# Patient Record
Sex: Male | Born: 1960 | Race: White | Hispanic: No | Marital: Married | Smoking: Former smoker
Health system: Southern US, Community
[De-identification: ages and names within clinical notes are randomized; demographics above are authoritative.]

## PROBLEM LIST (undated history)

## (undated) DIAGNOSIS — I509 Heart failure, unspecified: Secondary | ICD-10-CM

## (undated) DIAGNOSIS — E7800 Pure hypercholesterolemia, unspecified: Secondary | ICD-10-CM

## (undated) DIAGNOSIS — I213 ST elevation (STEMI) myocardial infarction of unspecified site: Secondary | ICD-10-CM

## (undated) DIAGNOSIS — E785 Hyperlipidemia, unspecified: Secondary | ICD-10-CM

## (undated) DIAGNOSIS — I519 Heart disease, unspecified: Secondary | ICD-10-CM

## (undated) DIAGNOSIS — I2510 Atherosclerotic heart disease of native coronary artery without angina pectoris: Secondary | ICD-10-CM

## (undated) DIAGNOSIS — R600 Localized edema: Secondary | ICD-10-CM

## (undated) DIAGNOSIS — F419 Anxiety disorder, unspecified: Secondary | ICD-10-CM

## (undated) DIAGNOSIS — Z87442 Personal history of urinary calculi: Secondary | ICD-10-CM

## (undated) DIAGNOSIS — J189 Pneumonia, unspecified organism: Secondary | ICD-10-CM

## (undated) DIAGNOSIS — G4730 Sleep apnea, unspecified: Secondary | ICD-10-CM

## (undated) DIAGNOSIS — I480 Paroxysmal atrial fibrillation: Secondary | ICD-10-CM

## (undated) DIAGNOSIS — E119 Type 2 diabetes mellitus without complications: Secondary | ICD-10-CM

## (undated) DIAGNOSIS — E876 Hypokalemia: Secondary | ICD-10-CM

## (undated) DIAGNOSIS — R0789 Other chest pain: Secondary | ICD-10-CM

## (undated) DIAGNOSIS — R296 Repeated falls: Secondary | ICD-10-CM

## (undated) DIAGNOSIS — I872 Venous insufficiency (chronic) (peripheral): Secondary | ICD-10-CM

## (undated) DIAGNOSIS — I10 Essential (primary) hypertension: Secondary | ICD-10-CM

## (undated) HISTORY — DX: ST elevation (STEMI) myocardial infarction of unspecified site: I21.3

## (undated) HISTORY — PX: APPENDECTOMY: SHX54

## (undated) HISTORY — DX: Atherosclerotic heart disease of native coronary artery without angina pectoris: I25.10

## (undated) HISTORY — DX: Localized edema: R60.0

## (undated) HISTORY — DX: Venous insufficiency (chronic) (peripheral): I87.2

## (undated) HISTORY — PX: LAPAROSCOPIC INCISIONAL / UMBILICAL / VENTRAL HERNIA REPAIR: SUR789

## (undated) HISTORY — DX: Hypokalemia: E87.6

## (undated) HISTORY — DX: Anxiety disorder, unspecified: F41.9

## (undated) HISTORY — DX: Paroxysmal atrial fibrillation: I48.0

## (undated) HISTORY — PX: HERNIA REPAIR: SHX51

## (undated) HISTORY — DX: Essential (primary) hypertension: I10

## (undated) HISTORY — DX: Other chest pain: R07.89

## (undated) HISTORY — DX: Hyperlipidemia, unspecified: E78.5

## (undated) HISTORY — DX: Heart disease, unspecified: I51.9

## (undated) HISTORY — DX: Repeated falls: R29.6

---

## 1998-01-18 ENCOUNTER — Encounter: Admission: RE | Admit: 1998-01-18 | Discharge: 1998-01-18 | Payer: Self-pay | Admitting: *Deleted

## 2012-01-06 HISTORY — PX: CORONARY ANGIOPLASTY WITH STENT PLACEMENT: SHX49

## 2014-04-06 HISTORY — PX: CORONARY ANGIOPLASTY: SHX604

## 2014-07-06 DIAGNOSIS — I213 ST elevation (STEMI) myocardial infarction of unspecified site: Secondary | ICD-10-CM

## 2014-07-06 HISTORY — PX: CORONARY ARTERY BYPASS GRAFT: SHX141

## 2014-07-06 HISTORY — DX: ST elevation (STEMI) myocardial infarction of unspecified site: I21.3

## 2016-01-17 ENCOUNTER — Other Ambulatory Visit: Payer: Self-pay | Admitting: *Deleted

## 2016-01-17 ENCOUNTER — Encounter: Payer: Self-pay | Admitting: Surgery

## 2016-01-17 DIAGNOSIS — I872 Venous insufficiency (chronic) (peripheral): Secondary | ICD-10-CM

## 2016-01-25 ENCOUNTER — Inpatient Hospital Stay (HOSPITAL_COMMUNITY)
Admission: EM | Admit: 2016-01-25 | Discharge: 2016-02-03 | DRG: 271 | Disposition: A | Discharge status: Home / Self Care | Payer: Medicaid Other | Attending: Internal Medicine | Admitting: Internal Medicine

## 2016-01-25 ENCOUNTER — Encounter (HOSPITAL_COMMUNITY): Payer: Self-pay | Admitting: Emergency Medicine

## 2016-01-25 ENCOUNTER — Emergency Department (HOSPITAL_COMMUNITY): Payer: Medicaid Other

## 2016-01-25 DIAGNOSIS — Z951 Presence of aortocoronary bypass graft: Secondary | ICD-10-CM

## 2016-01-25 DIAGNOSIS — Z9889 Other specified postprocedural states: Secondary | ICD-10-CM

## 2016-01-25 DIAGNOSIS — I5042 Chronic combined systolic (congestive) and diastolic (congestive) heart failure: Secondary | ICD-10-CM | POA: Diagnosis present

## 2016-01-25 DIAGNOSIS — I70203 Unspecified atherosclerosis of native arteries of extremities, bilateral legs: Principal | ICD-10-CM | POA: Diagnosis present

## 2016-01-25 DIAGNOSIS — J449 Chronic obstructive pulmonary disease, unspecified: Secondary | ICD-10-CM | POA: Diagnosis present

## 2016-01-25 DIAGNOSIS — E872 Acidosis, unspecified: Secondary | ICD-10-CM

## 2016-01-25 DIAGNOSIS — Z7984 Long term (current) use of oral hypoglycemic drugs: Secondary | ICD-10-CM

## 2016-01-25 DIAGNOSIS — I959 Hypotension, unspecified: Secondary | ICD-10-CM | POA: Diagnosis present

## 2016-01-25 DIAGNOSIS — E119 Type 2 diabetes mellitus without complications: Secondary | ICD-10-CM

## 2016-01-25 DIAGNOSIS — Z7982 Long term (current) use of aspirin: Secondary | ICD-10-CM

## 2016-01-25 DIAGNOSIS — Z9861 Coronary angioplasty status: Secondary | ICD-10-CM

## 2016-01-25 DIAGNOSIS — L89309 Pressure ulcer of unspecified buttock, unspecified stage: Secondary | ICD-10-CM | POA: Diagnosis present

## 2016-01-25 DIAGNOSIS — E871 Hypo-osmolality and hyponatremia: Secondary | ICD-10-CM | POA: Diagnosis present

## 2016-01-25 DIAGNOSIS — E1165 Type 2 diabetes mellitus with hyperglycemia: Secondary | ICD-10-CM | POA: Diagnosis present

## 2016-01-25 DIAGNOSIS — I5032 Chronic diastolic (congestive) heart failure: Secondary | ICD-10-CM | POA: Diagnosis present

## 2016-01-25 DIAGNOSIS — D72829 Elevated white blood cell count, unspecified: Secondary | ICD-10-CM | POA: Diagnosis present

## 2016-01-25 DIAGNOSIS — I480 Paroxysmal atrial fibrillation: Secondary | ICD-10-CM | POA: Diagnosis present

## 2016-01-25 DIAGNOSIS — R52 Pain, unspecified: Secondary | ICD-10-CM | POA: Diagnosis present

## 2016-01-25 DIAGNOSIS — E785 Hyperlipidemia, unspecified: Secondary | ICD-10-CM | POA: Diagnosis present

## 2016-01-25 DIAGNOSIS — E876 Hypokalemia: Secondary | ICD-10-CM | POA: Diagnosis not present

## 2016-01-25 DIAGNOSIS — Z7401 Bed confinement status: Secondary | ICD-10-CM

## 2016-01-25 DIAGNOSIS — E1151 Type 2 diabetes mellitus with diabetic peripheral angiopathy without gangrene: Secondary | ICD-10-CM | POA: Diagnosis present

## 2016-01-25 DIAGNOSIS — I739 Peripheral vascular disease, unspecified: Secondary | ICD-10-CM | POA: Diagnosis present

## 2016-01-25 DIAGNOSIS — K5900 Constipation, unspecified: Secondary | ICD-10-CM | POA: Diagnosis not present

## 2016-01-25 DIAGNOSIS — I252 Old myocardial infarction: Secondary | ICD-10-CM

## 2016-01-25 DIAGNOSIS — F419 Anxiety disorder, unspecified: Secondary | ICD-10-CM | POA: Diagnosis present

## 2016-01-25 DIAGNOSIS — I110 Hypertensive heart disease with heart failure: Secondary | ICD-10-CM | POA: Diagnosis present

## 2016-01-25 DIAGNOSIS — Z79899 Other long term (current) drug therapy: Secondary | ICD-10-CM

## 2016-01-25 DIAGNOSIS — Z7901 Long term (current) use of anticoagulants: Secondary | ICD-10-CM

## 2016-01-25 DIAGNOSIS — I2510 Atherosclerotic heart disease of native coronary artery without angina pectoris: Secondary | ICD-10-CM | POA: Diagnosis present

## 2016-01-25 DIAGNOSIS — F329 Major depressive disorder, single episode, unspecified: Secondary | ICD-10-CM | POA: Diagnosis present

## 2016-01-25 DIAGNOSIS — F17210 Nicotine dependence, cigarettes, uncomplicated: Secondary | ICD-10-CM | POA: Diagnosis present

## 2016-01-25 HISTORY — DX: Heart failure, unspecified: I50.9

## 2016-01-25 LAB — COMPREHENSIVE METABOLIC PANEL
ALK PHOS: 110 U/L (ref 38–126)
ALT: 29 U/L (ref 17–63)
ANION GAP: 14 (ref 5–15)
AST: 30 U/L (ref 15–41)
Albumin: 4 g/dL (ref 3.5–5.0)
BUN: 13 mg/dL (ref 6–20)
CALCIUM: 8.7 mg/dL — AB (ref 8.9–10.3)
CO2: 24 mmol/L (ref 22–32)
CREATININE: 1.01 mg/dL (ref 0.61–1.24)
Chloride: 92 mmol/L — ABNORMAL LOW (ref 101–111)
Glucose, Bld: 401 mg/dL — ABNORMAL HIGH (ref 65–99)
Potassium: 3.5 mmol/L (ref 3.5–5.1)
SODIUM: 130 mmol/L — AB (ref 135–145)
TOTAL PROTEIN: 6.6 g/dL (ref 6.5–8.1)
Total Bilirubin: 1 mg/dL (ref 0.3–1.2)

## 2016-01-25 LAB — CBC WITH DIFFERENTIAL/PLATELET
Basophils Absolute: 0.1 10*3/uL (ref 0.0–0.1)
Basophils Relative: 0 %
EOS ABS: 0.4 10*3/uL (ref 0.0–0.7)
EOS PCT: 4 %
HCT: 47.4 % (ref 39.0–52.0)
HEMOGLOBIN: 16.8 g/dL (ref 13.0–17.0)
LYMPHS ABS: 3.1 10*3/uL (ref 0.7–4.0)
LYMPHS PCT: 25 %
MCH: 32 pg (ref 26.0–34.0)
MCHC: 35.4 g/dL (ref 30.0–36.0)
MCV: 90.3 fL (ref 78.0–100.0)
MONOS PCT: 7 %
Monocytes Absolute: 0.9 10*3/uL (ref 0.1–1.0)
Neutro Abs: 8.2 10*3/uL — ABNORMAL HIGH (ref 1.7–7.7)
Neutrophils Relative %: 64 %
Platelets: 218 10*3/uL (ref 150–400)
RBC: 5.25 MIL/uL (ref 4.22–5.81)
RDW: 13.5 % (ref 11.5–15.5)
WBC: 12.7 10*3/uL — ABNORMAL HIGH (ref 4.0–10.5)

## 2016-01-25 LAB — I-STAT CG4 LACTIC ACID, ED
Lactic Acid, Venous: 2.92 mmol/L (ref 0.5–1.9)
Lactic Acid, Venous: 1.94 mmol/L (ref 0.5–1.9)

## 2016-01-25 LAB — PROTIME-INR
INR: 1.05
PROTHROMBIN TIME: 13.7 s (ref 11.4–15.2)

## 2016-01-25 IMAGING — CT CT ANGIO AOBIFEM WO/W CM
1 of 10 series · 1 of 16 positions shown, 2 images · IV contrast (Iodine)
Comparison: None.

CLINICAL DATA: 55 y/o M; 55 y/o M; chest pain and hip pain down to
the toes with history of blood flow problems to the legs in the
past.

EXAM:
CT ANGIOGRAPHY OF ABDOMINAL AORTA WITH ILIOFEMORAL RUNOFF
TECHNIQUE: Multidetector CT imaging of the abdomen, pelvis and lower
extremities was performed using the standard protocol during bolus
administration of intravenous contrast. Multiplanar CT image
reconstructions and MIPs were obtained to evaluate the vascular
anatomy.
CONTRAST:  100 cc Isovue 370

[Series 300: locator · axial · 0.38mm/px · z∈[-52,-52]mm · 1 of 1 slices shown, 2 images]
[im 1/1  soft-tissue]
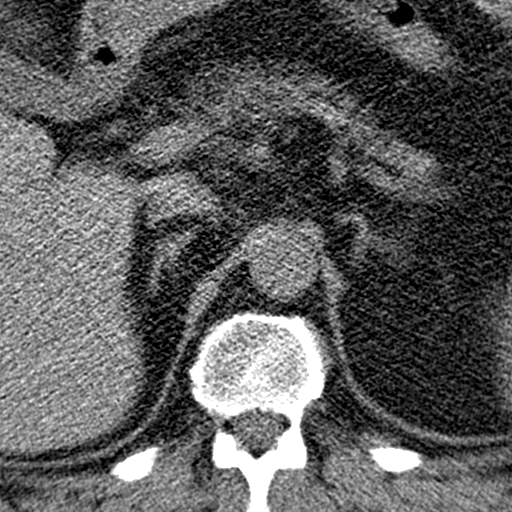
[im 1/1  bone]
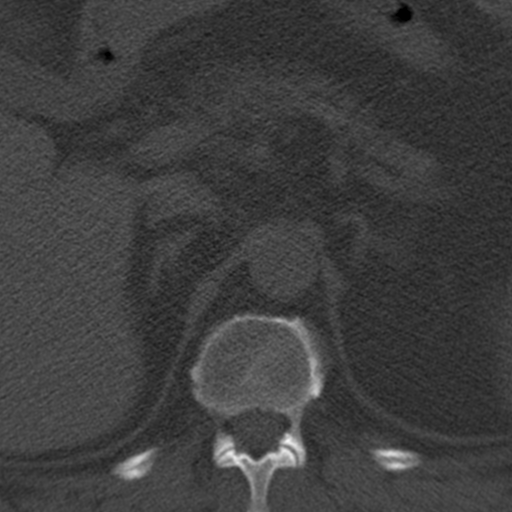

[1 of 16 positions shown; findings below may reference images not displayed]

FINDINGS: VASCULAR

Aorta: Normal caliber aorta without aneurysm, dissection, vasculitis
or significant stenosis. Severe aortic atherosclerosis with
extensive shaggy fibrofatty plaque.

Celiac: Calcified plaque of celiac artery origin with moderate 50%
stenosis (sagittal image 110).

SMA: Patent without evidence of aneurysm, dissection, vasculitis or
significant stenosis.

Renals: Both renal arteries are patent without evidence of aneurysm,
dissection, vasculitis, fibromuscular dysplasia or significant
stenosis. There is an accessory left renal artery with severe
stenosis of the origin due to fibrofatty plaque (series 501, image
84).

IMA: Patent with severe stenosis of the origin due to fibrofatty
plaque (series 507, image 112).

RIGHT Lower Extremity

Inflow: Common, internal and external iliac arteries are patent
without evidence of aneurysm, dissection, or vasculitis. There is
severe calcific and fibrofatty plaque throughout the common,
internal, and external iliac artery's.

Short segment of mild-to-moderate stenosis of right proximal common
iliac artery (series 501, image 117).

Mild stenosis of proximal right external iliac artery.

Severe thread-like patency of proximal bilateral internal iliac
arteries.

Outflow: Common, superficial and profunda femoral arteries and the
popliteal artery are patent without evidence of aneurysm,
dissection, or vasculitis.

Severe stenosis of right superficial femoral artery origin at the
bifurcation (series 501, image 211) and multiple segments of
mild-to-moderate stenosis throughout its course.

Moderate stenosis of the profunda femoral origin (series 507, image
61).

Mild bilateral popliteal artery stenosis.

Runoff: Patent three vessel runoff to the ankle.

LEFT Lower Extremity

Inflow: Common, internal and external iliac arteries are patent
without evidence of aneurysm, dissection, or vasculitis.

Mild diffuse stenosis of left common iliac artery.

Long segment of moderate stenosis of left proximal external iliac
artery.

Severe stenosis thread-like patency of proximal bilateral internal
iliac arteries.

Outflow: Common, superficial and profunda femoral arteries and the
popliteal artery are patent without evidence of aneurysm,
dissection, or vasculitis.

Severe stenosis/near occlusion of left superficial femoral artery
origin (series 501, image 212), multiple segments of
mild-to-moderate stenosis throughout the vessel course and single
short segment of severe stenosis within the mid thigh (series 501
image 282 and series 507, image 133).

Severe stenosis of the left profunda femoral origin (series 507,
image 141).

Mild bilateral popliteal artery stenosis.

Runoff: Patent three vessel runoff to the ankle.

Veins: No obvious venous abnormality within the limitations of this
arterial phase study.

Review of the MIP images confirms the above findings.

NON-VASCULAR

Lower chest: No acute abnormality.

Hepatobiliary: No focal liver abnormality is seen. No gallstones,
gallbladder wall thickening, or biliary dilatation.

Pancreas: Unremarkable. No pancreatic ductal dilatation or
surrounding inflammatory changes.

Spleen: Normal in size without focal abnormality.

Adrenals/Urinary Tract: Adrenal glands are unremarkable. Kidneys are
normal, without renal calculi, focal lesion, or hydronephrosis.
Bladder is unremarkable.

Stomach/Bowel: Stomach is within normal limits. Appendix appears
normal. No evidence of bowel wall thickening, distention, or
inflammatory changes.

Lymphatic: No lymphadenopathy.

Reproductive: Prostate is unremarkable.

Other: No abdominal wall hernia or abnormality. No abdominopelvic
ascites.

Musculoskeletal: Mild multilevel spinal spondylosis with lower
lumbar facet arthropathy. Mild osteoarthrosis of the hip joints
bilaterally with acetabular productive changes.
IMPRESSION: VASCULAR

1. Severe aortic atherosclerosis with extensive shaggy fibrofatty
plaque. No high-grade stenosis or aneurysm.
2. Patent three-vessel runoff bilaterally to the ankles.
3. Multiple levels of mild-to-moderate stenosis throughout the
iliac, femoral, and popliteal arteries.
4. Severe stenosis of bilateral superficial femoral artery origins
greater on the left where there is near occlusion. Additional short
segment of severe stenosis of left superficial femoral artery in the
mid thigh.
5. Moderate stenosis of right profunda femoral origin and severe
stenosis of left profunda femoral origin.
6. Severe stenosis/near occlusion of proximal common iliac artery
origins bilaterally.
7. Moderate stenosis of celiac artery origin.
8. Severe stenosis of left accessory renal artery origin.
9. Severe stenosis of IMA origin.

NON-VASCULAR

No acute process of abdomen or pelvis identified.

By: ESPAILLAT M.D.

## 2016-01-25 MED ORDER — HYDROCODONE-ACETAMINOPHEN 5-325 MG PO TABS
2.0000 | ORAL_TABLET | Freq: Once | ORAL | Status: DC
  Filled 2016-01-25: qty 2

## 2016-01-25 MED ORDER — IOPAMIDOL (ISOVUE-370) INJECTION 76%
INTRAVENOUS | Status: AC
  Administered 2016-01-25: 100 mL
  Filled 2016-01-25: qty 100

## 2016-01-25 MED ORDER — SODIUM CHLORIDE 0.9 % IV BOLUS (SEPSIS)
1000.0000 mL | Freq: Once | INTRAVENOUS | Status: AC
  Administered 2016-01-25: 1000 mL via INTRAVENOUS

## 2016-01-25 MED ORDER — HYDROCODONE-ACETAMINOPHEN 5-325 MG PO TABS
1.0000 | ORAL_TABLET | Freq: Once | ORAL | Status: AC
  Administered 2016-01-25: 1 via ORAL
  Filled 2016-01-25: qty 1

## 2016-01-25 NOTE — ED Triage Notes (Signed)
Patient here with ongoing chronic lower leg pain that he reports for months. States he has known blockage and has referral to vascular. Positive pulses, just increased pain

## 2016-01-25 NOTE — ED Notes (Signed)
Pt taken to restroom via wheelchair.

## 2016-01-25 NOTE — ED Provider Notes (Addendum)
[facility]-EMERGENCY DEPT Provider Note   CSN: [id number] Arrival date & time: 01/25/16  1639     History   Chief Complaint No chief complaint on file.   HPI Osama Coleson is a 56 y.o. male.  HPI   56 yo M with PMHx Coronary disease, peripheral vascular disease, hypertension, hyperlipidemia, A. fib on Eliquis, who presents with bilateral leg pain. Patient states that he has had several months of progressively worsening bilateral leg pain and cramping that is worse with exertion. However, over the last several days, his pain has acutely worsen. He states that while his pain initially was only with exertion, it progressed to with walking only several days ago and now is present at rest. The pain is an aching, gnawing, throbbing sensation that improves with elevation of his legs. Denies any associated abdominal pain. Over the last 24 hours, he has been unable to get out of bed due to the pain in his legs. He currently is being referred for vascular workup but has not established care.  Past Medical History:  Diagnosis Date  . Acute ST-segment elevation myocardial infarction (HCC)   . Anxiety   . Bilateral leg edema   . Bilateral leg edema   . CAD (coronary artery disease)   . CHF (congestive heart failure) (HCC)   . Chronic venous insufficiency   . Clinical depression   . Diabetes mellitus without complication (HCC)   . Falling episodes   . Hyperlipidemia   . Hypertension   . Hypokalemia   . Left ventricular systolic dysfunction   . Paroxysmal atrial fibrillation (HCC)   . Sternum pain     Patient Active Problem List   Diagnosis Date Noted  . Vascular claudication (HCC) 01/26/2016  . PAD (peripheral artery disease) (HCC) 01/26/2016  . Intractable pain 01/26/2016  . Bed sore on buttock 01/26/2016  . Diabetes mellitus type II, non insulin dependent (HCC) 01/26/2016  . CAD (coronary artery disease) 01/26/2016  . PAF (paroxysmal atrial fibrillation) (HCC) 01/26/2016  .  Hyponatremia 01/26/2016  . Chronic diastolic CHF (congestive heart failure) (HCC) 01/26/2016    Past Surgical History:  Procedure Laterality Date  . APPENDECTOMY    . CORONARY ANGIOPLASTY  01/2012  . CORONARY ARTERY BYPASS GRAFT    . HERNIA REPAIR         Home Medications    Prior to Admission medications   Medication Sig Start Date End Date Taking? Authorizing Provider  albuterol (PROVENTIL HFA;VENTOLIN HFA) 108 (90 Base) MCG/ACT inhaler Inhale 2 puffs into the lungs every 6 (six) hours as needed for wheezing or shortness of breath.    Yes Historical Provider, MD  amiodarone (PACERONE) 400 MG tablet Take 400 mg by mouth daily.   Yes Historical Provider, MD  amitriptyline (ELAVIL) 25 MG tablet Take 25 mg by mouth at bedtime as needed for sleep.    Yes Historical Provider, MD  apixaban (ELIQUIS) 5 MG TABS tablet Take 5 mg by mouth 2 (two) times daily.   Yes Historical Provider, MD  aspirin EC 81 MG tablet Take 81 mg by mouth daily.   Yes Historical Provider, MD  atorvastatin (LIPITOR) 80 MG tablet Take 80 mg by mouth every morning.    Yes Historical Provider, MD  carvedilol (COREG) 6.25 MG tablet Take 6.25 mg by mouth 2 (two) times daily with a meal.   Yes Historical Provider, MD  clonazePAM (KLONOPIN) 1 MG tablet Take 1 mg by mouth 2 (two) times daily.    Yes Historical  Provider, MD  famotidine (PEPCID) 20 MG tablet Take 20 mg by mouth 2 (two) times daily.   Yes Historical Provider, MD  furosemide (LASIX) 40 MG tablet Take 120 mg by mouth 2 (two) times daily.    Yes Historical Provider, MD  lisinopril (PRINIVIL,ZESTRIL) 5 MG tablet Take 5 mg by mouth daily.   Yes Historical Provider, MD  metFORMIN (GLUCOPHAGE) 1000 MG tablet Take 1,000 mg by mouth 2 (two) times daily with a meal.   Yes Historical Provider, MD  nitroGLYCERIN (NITROSTAT) 0.4 MG SL tablet Place 0.4 mg under the tongue every 5 (five) minutes as needed for chest pain.    Yes Historical Provider, MD  nystatin-triamcinolone  (MYCOLOG II) cream Apply 1 application topically 2 (two) times daily as needed (rash).    Yes Historical Provider, MD  Omega-3 Fatty Acids (FISH OIL) 1200 MG CAPS Take 1,200 mg by mouth 2 (two) times daily. Per medication list patient to take 1200 mg  2 (two) oral two times daily   Yes Historical Provider, MD  potassium chloride (KLOR-CON) 20 MEQ packet Take 20 mEq by mouth daily.   Yes Historical Provider, MD    Family History Family History  Problem Relation Age of Onset  . Heart disease Mother   . Hyperlipidemia Mother   . Hypertension Mother   . Cancer Father     lung and prostate   . Diabetes Father     Social History Social History  Substance Use Topics  . Smoking status: Current Every Day Smoker    Years: 25.00    Types: Cigarettes  . Smokeless tobacco: Never Used  . Alcohol use Yes     Allergies   Patient has no known allergies.   Review of Systems Review of Systems  Constitutional: Positive for fatigue. Negative for chills and fever.  Cardiovascular: Positive for leg swelling.  Musculoskeletal: Positive for gait problem.  Neurological: Positive for numbness. Negative for weakness and light-headedness.  All other systems reviewed and are negative.    Physical Exam Updated Vital Signs BP (!) 102/58   Pulse 66   Temp 97.9 F (36.6 C) (Oral)   Resp 10   SpO2 98%   Physical Exam  Constitutional: He is oriented to person, place, and time. He appears well-developed and well-nourished. He appears distressed (Appears uncomfortable and in pain).  HENT:  Head: Normocephalic and atraumatic.  Eyes: Conjunctivae are normal.  Neck: Neck supple.  Cardiovascular: Normal rate, regular rhythm and normal heart sounds.  Exam reveals no friction rub.   No murmur heard. Pulmonary/Chest: Effort normal and breath sounds normal. No respiratory distress. He has no wheezes. He has no rales.  Abdominal: He exhibits no distension.  Musculoskeletal: He exhibits no edema.    Neurological: He is alert and oriented to person, place, and time. He exhibits normal muscle tone.  Skin: Skin is warm. Capillary refill takes less than 2 seconds.  Psychiatric: He has a normal mood and affect.  Nursing note and vitals reviewed.   LOWER EXTREMITY EXAM: BILATERAL  INSPECTION & PALPATION: No gross deformity.  No swelling.  No open wounds.  No tenderness to palpation.   SENSORY: sensation is intact to light touch in:  Superficial peroneal nerve distribution (over dorsum of foot) Deep peroneal nerve distribution (over first dorsal web space) Sural nerve distribution (over lateral aspect 5th metatarsal) Saphenous nerve distribution (over medial instep)  MOTOR:  + Motor EHL (great toe dorsiflexion) + FHL (great toe plantar flexion)  + TA (  ankle dorsiflexion)  + GSC (ankle plantar flexion)  VASCULAR: Cap refill <2 seconds  Dopplerable but non-palpable DP pulse on right, no dopplerable DP on left Dopplerable but non-palpable PT pulses b/l   ED Treatments / Results  Labs (all labs ordered are listed, but only abnormal results are displayed) Labs Reviewed  CBC WITH DIFFERENTIAL/PLATELET - Abnormal; Notable for the following:       Result Value   WBC 12.7 (*)    Neutro Abs 8.2 (*)    All other components within normal limits  COMPREHENSIVE METABOLIC PANEL - Abnormal; Notable for the following:    Sodium 130 (*)    Chloride 92 (*)    Glucose, Bld 401 (*)    Calcium 8.7 (*)    All other components within normal limits  HEPARIN LEVEL (UNFRACTIONATED) - Abnormal; Notable for the following:    Heparin Unfractionated 0.77 (*)    All other components within normal limits  APTT - Abnormal; Notable for the following:    aPTT 47 (*)    All other components within normal limits  GLUCOSE, CAPILLARY - Abnormal; Notable for the following:    Glucose-Capillary 306 (*)    All other components within normal limits  GLUCOSE, CAPILLARY - Abnormal; Notable for the  following:    Glucose-Capillary 257 (*)    All other components within normal limits  I-STAT CG4 LACTIC ACID, ED - Abnormal; Notable for the following:    Lactic Acid, Venous 2.92 (*)    All other components within normal limits  I-STAT CG4 LACTIC ACID, ED - Abnormal; Notable for the following:    Lactic Acid, Venous 1.94 (*)    All other components within normal limits  CBG MONITORING, ED - Abnormal; Notable for the following:    Glucose-Capillary 321 (*)    All other components within normal limits  PROTIME-INR  CK  HEMOGLOBIN A1C  URINALYSIS, ROUTINE W REFLEX MICROSCOPIC    EKG  EKG Interpretation  Date/Time:  Saturday January 25 2016 20:30:53 EST Ventricular Rate:  64 PR Interval:  148 QRS Duration: 94 QT Interval:  482 QTC Calculation: 497 R Axis:   27 Text Interpretation:  Normal sinus rhythm Anteroseptal infarct , age undetermined Abnormal ECG No old tracing to compare Confirmed by FLOYD MD, DANIEL [phone]) on 01/26/2016 11:42:18 AM       Radiology Ct Angio Aortobifemoral W And/or Wo Contrast  Result Date: 01/25/2016 CLINICAL DATA:  56 y/o M; 56 y/o M; chest pain and hip pain down to the toes with history of blood flow problems to the legs in the past. EXAM: CT ANGIOGRAPHY OF ABDOMINAL AORTA WITH ILIOFEMORAL RUNOFF TECHNIQUE: Multidetector CT imaging of the abdomen, pelvis and lower extremities was performed using the standard protocol during bolus administration of intravenous contrast. Multiplanar CT image reconstructions and MIPs were obtained to evaluate the vascular anatomy. CONTRAST:  100 cc Isovue 370 COMPARISON:  None. FINDINGS: VASCULAR Aorta: Normal caliber aorta without aneurysm, dissection, vasculitis or significant stenosis. Severe aortic atherosclerosis with extensive shaggy fibrofatty plaque. Celiac: Calcified plaque of celiac artery origin with moderate 50% stenosis (sagittal image 110). SMA: Patent without evidence of aneurysm, dissection, vasculitis or  significant stenosis. Renals: Both renal arteries are patent without evidence of aneurysm, dissection, vasculitis, fibromuscular dysplasia or significant stenosis. There is an accessory left renal artery with severe stenosis of the origin due to fibrofatty plaque (series 501, image 84). IMA: Patent with severe stenosis of the origin due to fibrofatty plaque (series 507, image  112). RIGHT Lower Extremity Inflow: Common, internal and external iliac arteries are patent without evidence of aneurysm, dissection, or vasculitis. There is severe calcific and fibrofatty plaque throughout the common, internal, and external iliac artery's. Short segment of mild-to-moderate stenosis of right proximal common iliac artery (series 501, image 117). Mild stenosis of proximal right external iliac artery. Severe thread-like patency of proximal bilateral internal iliac arteries. Outflow: Common, superficial and profunda femoral arteries and the popliteal artery are patent without evidence of aneurysm, dissection, or vasculitis. Severe stenosis of right superficial femoral artery origin at the bifurcation (series 501, image 211) and multiple segments of mild-to-moderate stenosis throughout its course. Moderate stenosis of the profunda femoral origin (series 507, image 61). Mild bilateral popliteal artery stenosis. Runoff: Patent three vessel runoff to the ankle. LEFT Lower Extremity Inflow: Common, internal and external iliac arteries are patent without evidence of aneurysm, dissection, or vasculitis. Mild diffuse stenosis of left common iliac artery. Long segment of moderate stenosis of left proximal external iliac artery. Severe stenosis thread-like patency of proximal bilateral internal iliac arteries. Outflow: Common, superficial and profunda femoral arteries and the popliteal artery are patent without evidence of aneurysm, dissection, or vasculitis. Severe stenosis/near occlusion of left superficial femoral artery origin (series  501, image 212), multiple segments of mild-to-moderate stenosis throughout the vessel course and single short segment of severe stenosis within the mid thigh (series 501 image 282 and series 507, image 133). Severe stenosis of the left profunda femoral origin (series 507, image 141). Mild bilateral popliteal artery stenosis. Runoff: Patent three vessel runoff to the ankle. Veins: No obvious venous abnormality within the limitations of this arterial phase study. Review of the MIP images confirms the above findings. NON-VASCULAR Lower chest: No acute abnormality. Hepatobiliary: No focal liver abnormality is seen. No gallstones, gallbladder wall thickening, or biliary dilatation. Pancreas: Unremarkable. No pancreatic ductal dilatation or surrounding inflammatory changes. Spleen: Normal in size without focal abnormality. Adrenals/Urinary Tract: Adrenal glands are unremarkable. Kidneys are normal, without renal calculi, focal lesion, or hydronephrosis. Bladder is unremarkable. Stomach/Bowel: Stomach is within normal limits. Appendix appears normal. No evidence of bowel wall thickening, distention, or inflammatory changes. Lymphatic: No lymphadenopathy. Reproductive: Prostate is unremarkable. Other: No abdominal wall hernia or abnormality. No abdominopelvic ascites. Musculoskeletal: Mild multilevel spinal spondylosis with lower lumbar facet arthropathy. Mild osteoarthrosis of the hip joints bilaterally with acetabular productive changes. IMPRESSION: VASCULAR 1. Severe aortic atherosclerosis with extensive shaggy fibrofatty plaque. No high-grade stenosis or aneurysm. 2. Patent three-vessel runoff bilaterally to the ankles. 3. Multiple levels of mild-to-moderate stenosis throughout the iliac, femoral, and popliteal arteries. 4. Severe stenosis of bilateral superficial femoral artery origins greater on the left where there is near occlusion. Additional short segment of severe stenosis of left superficial femoral artery in  the mid thigh. 5. Moderate stenosis of right profunda femoral origin and severe stenosis of left profunda femoral origin. 6. Severe stenosis/near occlusion of proximal common iliac artery origins bilaterally. 7. Moderate stenosis of celiac artery origin. 8. Severe stenosis of left accessory renal artery origin. 9. Severe stenosis of IMA origin. NON-VASCULAR No acute process of abdomen or pelvis identified. Electronically Signed   By: Mitzi Hansen M.D.   On: 01/25/2016 23:17   Dg Chest Port 1 View  Result Date: 01/26/2016 CLINICAL DATA:  Lactic acidosis, no chest pain or shortness of breath, history diabetes EXAM: PORTABLE CHEST 1 VIEW COMPARISON:  07/20/2014 FINDINGS: There is no focal parenchymal opacity. There is no pleural effusion or pneumothorax. The heart and  mediastinal contours are unremarkable. There is evidence of prior CABG. The osseous structures are unremarkable. IMPRESSION: No active disease. Electronically Signed   By: Elige Ko   On: 01/26/2016 09:48    Procedures Procedures (including critical care time)  Medications Ordered in ED Medications  albuterol (PROVENTIL) (2.5 MG/3ML) 0.083% nebulizer solution 2.5 mg (not administered)  amiodarone (PACERONE) tablet 400 mg (400 mg Oral Given 01/26/16 0920)  amitriptyline (ELAVIL) tablet 25 mg (not administered)  atorvastatin (LIPITOR) tablet 80 mg (80 mg Oral Given 01/26/16 0920)  carvedilol (COREG) tablet 6.25 mg (6.25 mg Oral Given 01/26/16 0920)  clonazePAM (KLONOPIN) tablet 1 mg (1 mg Oral Given 01/26/16 0920)  famotidine (PEPCID) tablet 20 mg (20 mg Oral Not Given 01/26/16 0928)  nitroGLYCERIN (NITROSTAT) SL tablet 0.4 mg (not administered)  omega-3 acid ethyl esters (LOVAZA) capsule 1 g (1 g Oral Given 01/26/16 0920)  potassium chloride SA (K-DUR,KLOR-CON) CR tablet 20 mEq (20 mEq Oral Given 01/26/16 0919)  insulin aspart (novoLOG) injection 0-20 Units (11 Units Subcutaneous Given 01/26/16 0921)  acetaminophen (TYLENOL)  tablet 650 mg (650 mg Oral Given 01/26/16 0448)    Or  acetaminophen (TYLENOL) suppository 650 mg ( Rectal See Alternative 01/26/16 0448)  polyethylene glycol (MIRALAX / GLYCOLAX) packet 17 g (not administered)  bisacodyl (DULCOLAX) EC tablet 5 mg (not administered)  ondansetron (ZOFRAN) tablet 4 mg (not administered)    Or  ondansetron (ZOFRAN) injection 4 mg (not administered)  hydrALAZINE (APRESOLINE) injection 10 mg (not administered)  heparin ADULT infusion 100 units/mL (25000 units/258mL sodium chloride 0.45%) (1,400 Units/hr Intravenous New Bag/Given 01/26/16 0232)  chlorhexidine (PERIDEX) 0.12 % solution 15 mL (15 mLs Mouth Rinse Given 01/26/16 0920)  MEDLINE mouth rinse (not administered)  HYDROcodone-acetaminophen (NORCO/VICODIN) 5-325 MG per tablet 1 tablet (not administered)  methocarbamol (ROBAXIN) tablet 500 mg (not administered)  morphine 2 MG/ML injection 2 mg (not administered)  HYDROcodone-acetaminophen (NORCO/VICODIN) 5-325 MG per tablet 1 tablet (1 tablet Oral Given 01/25/16 2011)  sodium chloride 0.9 % bolus 1,000 mL (0 mLs Intravenous Stopped 01/25/16 2151)  iopamidol (ISOVUE-370) 76 % injection (100 mLs  Contrast Given 01/25/16 2132)  HYDROcodone-acetaminophen (NORCO/VICODIN) 5-325 MG per tablet 1 tablet (1 tablet Oral Given 01/26/16 0028)     Initial Impression / Assessment and Plan / ED Course  I have reviewed the triage vital signs and the nursing notes.  Pertinent labs & imaging results that were available during my care of the patient were reviewed by me and considered in my medical decision making (see chart for details).     56 year old male with extensive past medical history including known peripheral arterial disease who presents with bilateral leg pain and cramping. On arrival, vital signs are stable. He has dopplerable pulses bilaterally, although DP is not palpable or dopplerable on left. Toes are well perfused. Initial lab work does show significant lactic  acidosis of 2.9 and mild leukocytosis. CT angios shows severe diffuse vascular disease, though he does have distal runoff. I discussed the labs and imaging with Dr. fields of vascular surgery. Given the patient's pain at rest, severe disease on scan, and progressive symptoms, Dr. Darrick Penna will see tomorrow and will plan for expedited inpatient angiogram. Given his comorbidities, recommends admission to hospitalist.   Final Clinical Impressions(s) / ED Diagnoses   Final diagnoses:  Lactic acidosis  Vascular claudication [facility])    New Prescriptions Current Discharge Medication List       Shaune Pollack, MD 01/26/16 1220    Shaune Pollack, MD  01/26/16 1221  

## 2016-01-26 ENCOUNTER — Inpatient Hospital Stay (HOSPITAL_COMMUNITY): Payer: Medicaid Other

## 2016-01-26 ENCOUNTER — Encounter (HOSPITAL_COMMUNITY): Payer: Self-pay | Admitting: Family Medicine

## 2016-01-26 DIAGNOSIS — M79609 Pain in unspecified limb: Secondary | ICD-10-CM

## 2016-01-26 DIAGNOSIS — J449 Chronic obstructive pulmonary disease, unspecified: Secondary | ICD-10-CM | POA: Diagnosis present

## 2016-01-26 DIAGNOSIS — Z9861 Coronary angioplasty status: Secondary | ICD-10-CM | POA: Diagnosis not present

## 2016-01-26 DIAGNOSIS — I70203 Unspecified atherosclerosis of native arteries of extremities, bilateral legs: Secondary | ICD-10-CM | POA: Diagnosis not present

## 2016-01-26 DIAGNOSIS — I5032 Chronic diastolic (congestive) heart failure: Secondary | ICD-10-CM

## 2016-01-26 DIAGNOSIS — E871 Hypo-osmolality and hyponatremia: Secondary | ICD-10-CM | POA: Diagnosis present

## 2016-01-26 DIAGNOSIS — L89309 Pressure ulcer of unspecified buttock, unspecified stage: Secondary | ICD-10-CM | POA: Diagnosis not present

## 2016-01-26 DIAGNOSIS — R52 Pain, unspecified: Secondary | ICD-10-CM | POA: Diagnosis not present

## 2016-01-26 DIAGNOSIS — D72829 Elevated white blood cell count, unspecified: Secondary | ICD-10-CM | POA: Diagnosis not present

## 2016-01-26 DIAGNOSIS — F17210 Nicotine dependence, cigarettes, uncomplicated: Secondary | ICD-10-CM | POA: Diagnosis present

## 2016-01-26 DIAGNOSIS — Z7982 Long term (current) use of aspirin: Secondary | ICD-10-CM | POA: Diagnosis not present

## 2016-01-26 DIAGNOSIS — E119 Type 2 diabetes mellitus without complications: Secondary | ICD-10-CM

## 2016-01-26 DIAGNOSIS — Z7401 Bed confinement status: Secondary | ICD-10-CM | POA: Diagnosis not present

## 2016-01-26 DIAGNOSIS — E785 Hyperlipidemia, unspecified: Secondary | ICD-10-CM | POA: Diagnosis present

## 2016-01-26 DIAGNOSIS — Z951 Presence of aortocoronary bypass graft: Secondary | ICD-10-CM | POA: Diagnosis not present

## 2016-01-26 DIAGNOSIS — F329 Major depressive disorder, single episode, unspecified: Secondary | ICD-10-CM | POA: Diagnosis present

## 2016-01-26 DIAGNOSIS — I2583 Coronary atherosclerosis due to lipid rich plaque: Secondary | ICD-10-CM

## 2016-01-26 DIAGNOSIS — E876 Hypokalemia: Secondary | ICD-10-CM | POA: Diagnosis not present

## 2016-01-26 DIAGNOSIS — I70222 Atherosclerosis of native arteries of extremities with rest pain, left leg: Secondary | ICD-10-CM | POA: Diagnosis not present

## 2016-01-26 DIAGNOSIS — I2510 Atherosclerotic heart disease of native coronary artery without angina pectoris: Secondary | ICD-10-CM

## 2016-01-26 DIAGNOSIS — E1151 Type 2 diabetes mellitus with diabetic peripheral angiopathy without gangrene: Secondary | ICD-10-CM | POA: Diagnosis not present

## 2016-01-26 DIAGNOSIS — I480 Paroxysmal atrial fibrillation: Secondary | ICD-10-CM | POA: Diagnosis not present

## 2016-01-26 DIAGNOSIS — I739 Peripheral vascular disease, unspecified: Secondary | ICD-10-CM

## 2016-01-26 DIAGNOSIS — Z7984 Long term (current) use of oral hypoglycemic drugs: Secondary | ICD-10-CM | POA: Diagnosis not present

## 2016-01-26 DIAGNOSIS — E1165 Type 2 diabetes mellitus with hyperglycemia: Secondary | ICD-10-CM | POA: Diagnosis not present

## 2016-01-26 DIAGNOSIS — M79606 Pain in leg, unspecified: Secondary | ICD-10-CM | POA: Diagnosis present

## 2016-01-26 DIAGNOSIS — Z7901 Long term (current) use of anticoagulants: Secondary | ICD-10-CM | POA: Diagnosis not present

## 2016-01-26 DIAGNOSIS — I959 Hypotension, unspecified: Secondary | ICD-10-CM | POA: Diagnosis not present

## 2016-01-26 DIAGNOSIS — F419 Anxiety disorder, unspecified: Secondary | ICD-10-CM | POA: Diagnosis present

## 2016-01-26 DIAGNOSIS — I110 Hypertensive heart disease with heart failure: Secondary | ICD-10-CM | POA: Diagnosis not present

## 2016-01-26 DIAGNOSIS — Z79899 Other long term (current) drug therapy: Secondary | ICD-10-CM | POA: Diagnosis not present

## 2016-01-26 DIAGNOSIS — I5042 Chronic combined systolic (congestive) and diastolic (congestive) heart failure: Secondary | ICD-10-CM | POA: Diagnosis not present

## 2016-01-26 LAB — GLUCOSE, CAPILLARY
GLUCOSE-CAPILLARY: 199 mg/dL — AB (ref 65–99)
GLUCOSE-CAPILLARY: 346 mg/dL — AB (ref 65–99)
Glucose-Capillary: 257 mg/dL — ABNORMAL HIGH (ref 65–99)
Glucose-Capillary: 289 mg/dL — ABNORMAL HIGH (ref 65–99)
Glucose-Capillary: 306 mg/dL — ABNORMAL HIGH (ref 65–99)
Glucose-Capillary: 309 mg/dL — ABNORMAL HIGH (ref 65–99)

## 2016-01-26 LAB — URINALYSIS, ROUTINE W REFLEX MICROSCOPIC
Bilirubin Urine: NEGATIVE
Hgb urine dipstick: NEGATIVE
Ketones, ur: NEGATIVE mg/dL
LEUKOCYTES UA: NEGATIVE
Nitrite: NEGATIVE
PROTEIN: NEGATIVE mg/dL
SPECIFIC GRAVITY, URINE: 1.042 — AB (ref 1.005–1.030)
pH: 6 (ref 5.0–8.0)

## 2016-01-26 LAB — CBG MONITORING, ED: GLUCOSE-CAPILLARY: 321 mg/dL — AB (ref 65–99)

## 2016-01-26 LAB — HEPARIN LEVEL (UNFRACTIONATED): Heparin Unfractionated: 0.77 IU/mL — ABNORMAL HIGH (ref 0.30–0.70)

## 2016-01-26 LAB — APTT
APTT: 64 s — AB (ref 24–36)
aPTT: 47 seconds — ABNORMAL HIGH (ref 24–36)

## 2016-01-26 LAB — CK: CK TOTAL: 90 U/L (ref 49–397)

## 2016-01-26 MED ORDER — MORPHINE SULFATE (PF) 2 MG/ML IV SOLN
2.0000 mg | INTRAVENOUS | Status: DC | PRN
  Administered 2016-01-26 – 2016-02-02 (×29): 2 mg via INTRAVENOUS
  Filled 2016-01-26 (×30): qty 1

## 2016-01-26 MED ORDER — ACETAMINOPHEN 650 MG RE SUPP: 650.0000 mg | Freq: Four times a day (QID) | RECTAL | Status: DC | PRN

## 2016-01-26 MED ORDER — AMITRIPTYLINE HCL 50 MG PO TABS: 25.0000 mg | ORAL_TABLET | Freq: Every evening | ORAL | Status: DC | PRN

## 2016-01-26 MED ORDER — NITROGLYCERIN 0.4 MG SL SUBL: 0.4000 mg | SUBLINGUAL_TABLET | SUBLINGUAL | Status: DC | PRN

## 2016-01-26 MED ORDER — HEPARIN (PORCINE) IN NACL 100-0.45 UNIT/ML-% IJ SOLN
1800.0000 [IU]/h | INTRAMUSCULAR | Status: AC
  Administered 2016-01-26: 1600 [IU]/h via INTRAVENOUS
  Administered 2016-01-26: 1400 [IU]/h via INTRAVENOUS
  Administered 2016-01-27 – 2016-01-29 (×3): 1800 [IU]/h via INTRAVENOUS
  Filled 2016-01-26 (×6): qty 250

## 2016-01-26 MED ORDER — MORPHINE SULFATE (PF) 4 MG/ML IV SOLN
4.0000 mg | Freq: Once | INTRAVENOUS | Status: DC
  Filled 2016-01-26: qty 1

## 2016-01-26 MED ORDER — MORPHINE SULFATE (PF) 4 MG/ML IV SOLN
1.0000 mg | INTRAVENOUS | Status: DC | PRN
  Administered 2016-01-26: 1 mg via INTRAVENOUS

## 2016-01-26 MED ORDER — HYDRALAZINE HCL 20 MG/ML IJ SOLN: 10.0000 mg | INTRAMUSCULAR | Status: DC | PRN

## 2016-01-26 MED ORDER — CARVEDILOL 6.25 MG PO TABS
6.2500 mg | ORAL_TABLET | Freq: Two times a day (BID) | ORAL | Status: DC
  Administered 2016-01-26 – 2016-01-27 (×4): 6.25 mg via ORAL
  Filled 2016-01-26 (×4): qty 1

## 2016-01-26 MED ORDER — HYDROCODONE-ACETAMINOPHEN 5-325 MG PO TABS
1.0000 | ORAL_TABLET | Freq: Four times a day (QID) | ORAL | Status: DC | PRN
  Administered 2016-01-26 – 2016-02-01 (×8): 1 via ORAL
  Filled 2016-01-26 (×9): qty 1

## 2016-01-26 MED ORDER — HYDROCODONE-ACETAMINOPHEN 5-325 MG PO TABS
1.0000 | ORAL_TABLET | Freq: Once | ORAL | Status: AC
  Administered 2016-01-26: 1 via ORAL
  Filled 2016-01-26: qty 1

## 2016-01-26 MED ORDER — ORAL CARE MOUTH RINSE: 15.0000 mL | Freq: Two times a day (BID) | OROMUCOSAL | Status: DC

## 2016-01-26 MED ORDER — AMIODARONE HCL 200 MG PO TABS
200.0000 mg | ORAL_TABLET | Freq: Every day | ORAL | Status: DC
  Administered 2016-01-27 – 2016-02-03 (×6): 200 mg via ORAL
  Filled 2016-01-26 (×6): qty 1

## 2016-01-26 MED ORDER — AMIODARONE HCL 200 MG PO TABS
400.0000 mg | ORAL_TABLET | Freq: Every day | ORAL | Status: DC
  Administered 2016-01-26: 400 mg via ORAL
  Filled 2016-01-26: qty 2

## 2016-01-26 MED ORDER — FUROSEMIDE 40 MG PO TABS
60.0000 mg | ORAL_TABLET | Freq: Two times a day (BID) | ORAL | Status: DC
  Administered 2016-01-27: 08:00:00 60 mg via ORAL
  Filled 2016-01-26: qty 1

## 2016-01-26 MED ORDER — ONDANSETRON HCL 4 MG PO TABS
4.0000 mg | ORAL_TABLET | Freq: Four times a day (QID) | ORAL | Status: DC | PRN
  Administered 2016-01-30: 4 mg via ORAL
  Filled 2016-01-26: qty 1

## 2016-01-26 MED ORDER — OXYMETAZOLINE HCL 0.05 % NA SOLN
1.0000 | Freq: Two times a day (BID) | NASAL | Status: DC
  Filled 2016-01-26: qty 15

## 2016-01-26 MED ORDER — ACETAMINOPHEN 325 MG PO TABS
650.0000 mg | ORAL_TABLET | Freq: Four times a day (QID) | ORAL | Status: DC | PRN
  Administered 2016-01-26: 650 mg via ORAL
  Filled 2016-01-26: qty 2

## 2016-01-26 MED ORDER — ONDANSETRON HCL 4 MG/2ML IJ SOLN
4.0000 mg | Freq: Four times a day (QID) | INTRAMUSCULAR | Status: DC | PRN
  Administered 2016-02-01 (×2): 4 mg via INTRAVENOUS
  Filled 2016-01-26 (×2): qty 2

## 2016-01-26 MED ORDER — CLONAZEPAM 1 MG PO TABS
1.0000 mg | ORAL_TABLET | Freq: Two times a day (BID) | ORAL | Status: DC
  Administered 2016-01-26 – 2016-02-03 (×15): 1 mg via ORAL
  Filled 2016-01-26 (×15): qty 1

## 2016-01-26 MED ORDER — CHLORHEXIDINE GLUCONATE 0.12 % MT SOLN
15.0000 mL | Freq: Two times a day (BID) | OROMUCOSAL | Status: DC
  Administered 2016-01-26: 15 mL via OROMUCOSAL
  Filled 2016-01-26: qty 15

## 2016-01-26 MED ORDER — HEPARIN BOLUS VIA INFUSION
1500.0000 [IU] | Freq: Once | INTRAVENOUS | Status: AC
  Administered 2016-01-26: 1500 [IU] via INTRAVENOUS
  Filled 2016-01-26: qty 1500

## 2016-01-26 MED ORDER — INSULIN ASPART 100 UNIT/ML ~~LOC~~ SOLN
0.0000 [IU] | SUBCUTANEOUS | Status: DC
  Administered 2016-01-26 (×3): 15 [IU] via SUBCUTANEOUS
  Administered 2016-01-26: 11 [IU] via SUBCUTANEOUS
  Administered 2016-01-26: 4 [IU] via SUBCUTANEOUS
  Administered 2016-01-27 (×4): 11 [IU] via SUBCUTANEOUS

## 2016-01-26 MED ORDER — METHOCARBAMOL 500 MG PO TABS
500.0000 mg | ORAL_TABLET | Freq: Three times a day (TID) | ORAL | Status: DC
  Administered 2016-01-26 – 2016-02-02 (×9): 500 mg via ORAL
  Filled 2016-01-26 (×20): qty 1

## 2016-01-26 MED ORDER — FAMOTIDINE 20 MG PO TABS
20.0000 mg | ORAL_TABLET | Freq: Two times a day (BID) | ORAL | Status: DC
  Administered 2016-01-26 – 2016-02-02 (×10): 20 mg via ORAL
  Filled 2016-01-26 (×16): qty 1

## 2016-01-26 MED ORDER — FUROSEMIDE 20 MG PO TABS
120.0000 mg | ORAL_TABLET | Freq: Two times a day (BID) | ORAL | Status: DC
  Administered 2016-01-26: 120 mg via ORAL
  Filled 2016-01-26: qty 6

## 2016-01-26 MED ORDER — ALBUTEROL SULFATE (2.5 MG/3ML) 0.083% IN NEBU: 2.5000 mg | INHALATION_SOLUTION | Freq: Four times a day (QID) | RESPIRATORY_TRACT | Status: DC | PRN

## 2016-01-26 MED ORDER — ATORVASTATIN CALCIUM 80 MG PO TABS
80.0000 mg | ORAL_TABLET | Freq: Every day | ORAL | Status: DC
  Administered 2016-01-26 – 2016-02-03 (×7): 80 mg via ORAL
  Filled 2016-01-26 (×7): qty 1

## 2016-01-26 MED ORDER — SODIUM CHLORIDE 0.9 % IV SOLN
INTRAVENOUS | Status: DC
  Administered 2016-01-26: 03:00:00 via INTRAVENOUS

## 2016-01-26 MED ORDER — POLYETHYLENE GLYCOL 3350 17 G PO PACK
17.0000 g | PACK | Freq: Every day | ORAL | Status: DC | PRN
  Administered 2016-01-27 – 2016-02-02 (×3): 17 g via ORAL
  Filled 2016-01-26 (×3): qty 1

## 2016-01-26 MED ORDER — BISACODYL 5 MG PO TBEC: 5.0000 mg | DELAYED_RELEASE_TABLET | Freq: Every day | ORAL | Status: DC | PRN

## 2016-01-26 MED ORDER — POTASSIUM CHLORIDE CRYS ER 20 MEQ PO TBCR
20.0000 meq | EXTENDED_RELEASE_TABLET | Freq: Every day | ORAL | Status: DC
  Administered 2016-01-26: 20 meq via ORAL
  Filled 2016-01-26: qty 1

## 2016-01-26 MED ORDER — OMEGA-3-ACID ETHYL ESTERS 1 G PO CAPS
1.0000 g | ORAL_CAPSULE | Freq: Two times a day (BID) | ORAL | Status: DC
  Administered 2016-01-26 – 2016-02-02 (×11): 1 g via ORAL
  Filled 2016-01-26 (×15): qty 1

## 2016-01-26 NOTE — Progress Notes (Signed)
Patient arrived back from Vascular US.

## 2016-01-26 NOTE — Progress Notes (Addendum)
ANTICOAGULATION CONSULT NOTE - Follow Up Consult  Pharmacy Consult for Heparin (holding apixaban) Indication: atrial fibrillation  No Known Allergies  Patient Measurements: Heparin Dosing Weight: 93.6 kg   Vital Signs: Temp: 97.9 F (36.6 C) (01/21 0429) Temp Source: Oral (01/21 0429) BP: 102/58 (01/21 0900) Pulse Rate: 66 (01/21 0429)  Labs:  Recent Labs  01/25/16 1936 01/26/16 0919  HGB 16.8  --   HCT 47.4  --   PLT 218  --   APTT  --  47*  LABPROT 13.7  --   INR 1.05  --   HEPARINUNFRC  --  0.77*  CREATININE 1.01  --   CKTOTAL  --  90    CrCl cannot be calculated (Unknown ideal weight.).   Medications:  Infusions:  . heparin 1,400 Units/hr (01/26/16 0232)    Assessment: 56 y/o M here with lower leg pain, on apixaban PTA for afib, holding apixaban and started heparin in anticipation of vascular procedure, last apixaban dose >12 hours prior to heparin start.   Will use aPTT to dose until aPTT and heparin levels are correlating given apixaban influence on anti-Xa levels.   APTT = 47 - SUB therapeutic on heparin 1400 units/hr Heparin level 0.77 (elevated due to recent apixaban)  No weight/Ht in system - had to wait for patient to get back to floor to obtain. RN has now entered as 112.5 kg. Heparin dosing weight of 93.6 kg  Goal of Therapy:  Heparin level 0.3-0.7 units/ml aPTT 66-102 seconds Monitor platelets by anticoagulation protocol: Yes   Plan:  Bolus heparin 1500 units x1 Increase heparin to 1600 units/hr Recheck aPTT in 6 hours.  Daily aPTT and heparin level until correlating Daily CBC and monitor for signs of bleeding while on therapy  Link SnufferJessica Nicol Herbig, PharmD, BCPS Clinical Pharmacist Clinical Phone 01/26/2016 until 3:30 PM - [phone] After hours, please call #[phone] 01/26/2016,10:49 AM

## 2016-01-26 NOTE — Progress Notes (Signed)
ANTICOAGULATION CONSULT NOTE - Initial Consult  Pharmacy Consult for Heparin (holding apixaban) Indication: atrial fibrillation  No Known Allergies  Vital Signs: Temp: 98.3 F (36.8 C) (01/20 1733) Temp Source: Oral (01/20 1733) BP: 104/55 (01/21 0130) Pulse Rate: 65 (01/21 0130)  Labs:  Recent Labs  01/25/16 1936  HGB 16.8  HCT 47.4  PLT 218  LABPROT 13.7  INR 1.05  CREATININE 1.01   Medical History: Past Medical History:  Diagnosis Date  . Acute ST-segment elevation myocardial infarction (HCC)   . Anxiety   . Bilateral leg edema   . Bilateral leg edema   . CAD (coronary artery disease)   . CHF (congestive heart failure) (HCC)   . Chronic venous insufficiency   . Clinical depression   . Diabetes mellitus without complication (HCC)   . Falling episodes   . Hyperlipidemia   . Hypertension   . Hypokalemia   . Left ventricular systolic dysfunction   . Paroxysmal atrial fibrillation (HCC)   . Sternum pain    Assessment: 56 y/o M here with lower leg pain, on apixaban PTA for afib, holding apixaban and starting heparin in anticipation of vascular procedure, last apixaban dose >12 hours ago, will start heparin now. Will likely need to use aPTT to dose for the next 24-48 hours given apixaban influence on anti-Xa levels.   Goal of Therapy:  Heparin level 0.3-0.7 units/ml aPTT 66-102 seconds Monitor platelets by anticoagulation protocol: Yes   Plan:  -Start heparin drip at 1400 units/hr -1000 aPTT/HL  Abran DukeLedford, Ariday Brinker 01/26/2016,1:44 AM

## 2016-01-26 NOTE — Progress Notes (Signed)
VASCULAR LAB PRELIMINARY  ARTERIAL  ABI completed:    RIGHT    LEFT    PRESSURE WAVEFORM  PRESSURE WAVEFORM  BRACHIAL 123 Triphasic BRACHIAL 113 Triphasic  DP 88 Monophasic DP 71 Dampened monophasic  PT 96 Biphasic PT 79 Dampened monophasic    RIGHT LEFT  ABI 0.78 0.64   ABIs indicate a moderate reduction in arterial flow bilaterally at rest with abnormal Doppler waveforms.  [name], RVS 01/26/2016, 2:58 PM 123

## 2016-01-26 NOTE — Progress Notes (Signed)
ANTICOAGULATION CONSULT NOTE - Follow Up Consult  Pharmacy Consult for Heparin (holding apixaban) Indication: atrial fibrillation  No Known Allergies  Patient Measurements: Heparin Dosing Weight: 93.6 kg   Vital Signs: Temp: 98.2 F (36.8 C) (01/21 2003) Temp Source: Oral (01/21 2003) BP: 125/62 (01/21 2003) Pulse Rate: 62 (01/21 2003)  Labs:  Recent Labs  01/25/16 1936 01/26/16 0919 01/26/16 2028  HGB 16.8  --   --   HCT 47.4  --   --   PLT 218  --   --   APTT  --  47* 64*  LABPROT 13.7  --   --   INR 1.05  --   --   HEPARINUNFRC  --  0.77*  --   CREATININE 1.01  --   --   CKTOTAL  --  90  --     Estimated Creatinine Clearance: 100.5 mL/min (by C-G formula based on SCr of 1.01 mg/dL).   Medications:  Infusions:  . heparin 1,600 Units/hr (01/26/16 1759)    Assessment: 56 y/o M here with lower leg pain, on apixaban PTA for afib, holding apixaban and started heparin in anticipation of vascular procedure, last apixaban dose >12 hours prior to heparin start.   Will use aPTT to dose until aPTT and heparin levels are correlating given apixaban influence on anti-Xa levels.   APTT now 64 (goal 66-102). No issues with infusion or sxs of bleeding.   Goal of Therapy:  Heparin level 0.3-0.7 units/ml aPTT 66-102 seconds Monitor platelets by anticoagulation protocol: Yes   Plan:  1. Increase heparin gtt to 1800 units/hr 2. Daily aPTT and heparin level until correlating 3. Daily CBC and monitor for signs of bleeding while on therapy  Pollyann SamplesAndy Tarick Parenteau, PharmD, BCPS 01/26/2016, 9:29 PM

## 2016-01-26 NOTE — Consult Note (Signed)
Referring Physician: Dr Wilmon Pali [facility]  Patient name: Marcus Glass MRN: [id number] DOB: 07-Mar-1960 Sex: male  REASON FOR CONSULT: leg pain  HPI: Marcus Glass is a 56 y.o. male with 2 year history of bilateral diffuse leg pain when walking.  Was evaluated by his cardiologist in [facility] in 2016 after CABG and told he had some narrowing of his leg arteries and to walk more to develop collaterals.  He is overall fairly sedentary baseline.  He does not walk much and says legs have progressively gotten slowly worse.  He basically sat in a chair and did nothing over the last several days and began to have pain in the anterior thighs and upper calves even while setting in recliner.  He denies rest pain in the feet or numbness tingling.  He admits to poor control of his diabetes says he can't afford the meds.  He apparently was recently approved for disability.  CABG 2016  [place] for chest pain used left leg vein.  No chest pain since.  He has not had a stress test in over a year. No wounds on feet.  Quit smoking 2016.  He has had some mild swelling in both legs over the last few weeks progressively worse last few days.  Has worn support hose in past.  Other medical problems include depression hyperlipidemia, hypertension, paroxysmal afib (on Eliquis) transitioned to heparin this morning.  Past Medical History:  Diagnosis Date  . Acute ST-segment elevation myocardial infarction (HCC)   . Anxiety   . Bilateral leg edema   . Bilateral leg edema   . CAD (coronary artery disease)   . CHF (congestive heart failure) (HCC)   . Chronic venous insufficiency   . Clinical depression   . Diabetes mellitus without complication (HCC)   . Falling episodes   . Hyperlipidemia   . Hypertension   . Hypokalemia   . Left ventricular systolic dysfunction   . Paroxysmal atrial fibrillation (HCC)   . Sternum pain    Past Surgical History:  Procedure Laterality Date  . APPENDECTOMY    . CORONARY  ANGIOPLASTY  01/2012  . CORONARY ARTERY BYPASS GRAFT    . HERNIA REPAIR      Family History  Problem Relation Age of Onset  . Heart disease Mother   . Hyperlipidemia Mother   . Hypertension Mother   . Cancer Father     lung and prostate   . Diabetes Father     SOCIAL HISTORY: Social History   Social History  . Marital status: Unknown    Spouse name: N/A  . Number of children: N/A  . Years of education: N/A   Occupational History  . Not on file.   Social History Main Topics  . Smoking status: Current Every Day Smoker    Years: 25.00    Types: Cigarettes  . Smokeless tobacco: Never Used  . Alcohol use Yes  . Drug use: No  . Sexual activity: Not on file   Other Topics Concern  . Not on file   Social History Narrative  . No narrative on file    No Known Allergies  Current Facility-Administered Medications  Medication Dose Route Frequency Provider Last Rate Last Dose  . 0.9 %  sodium chloride infusion   Intravenous Continuous Briscoe Deutscher, MD 75 mL/hr at 01/26/16 0248    . acetaminophen (TYLENOL) tablet 650 mg  650 mg Oral Q6H PRN Briscoe Deutscher, MD   650 mg  at 01/26/16 0448   Or  . acetaminophen (TYLENOL) suppository 650 mg  650 mg Rectal Q6H PRN Lavone Neriimothy S Opyd, MD      . albuterol (PROVENTIL) (2.5 MG/3ML) 0.083% nebulizer solution 2.5 mg  2.5 mg Inhalation Q6H PRN Briscoe Deutscherimothy S Opyd, MD      . amiodarone (PACERONE) tablet 400 mg  400 mg Oral Daily Briscoe Deutscherimothy S Opyd, MD      . amitriptyline (ELAVIL) tablet 25 mg  25 mg Oral QHS PRN Briscoe Deutscherimothy S Opyd, MD      . atorvastatin (LIPITOR) tablet 80 mg  80 mg Oral Daily Lavone Neriimothy S Opyd, MD      . bisacodyl (DULCOLAX) EC tablet 5 mg  5 mg Oral Daily PRN Briscoe Deutscherimothy S Opyd, MD      . carvedilol (COREG) tablet 6.25 mg  6.25 mg Oral BID WC Lavone Neriimothy S Opyd, MD      . chlorhexidine (PERIDEX) 0.12 % solution 15 mL  15 mL Mouth Rinse BID Rolly SalterPranav M Patel, MD      . clonazePAM (KLONOPIN) tablet 1 mg  1 mg Oral BID Briscoe Deutscherimothy S Opyd, MD      .  famotidine (PEPCID) tablet 20 mg  20 mg Oral BID Lavone Neriimothy S Opyd, MD      . furosemide (LASIX) tablet 120 mg  120 mg Oral BID Lavone Neriimothy S Opyd, MD      . heparin ADULT infusion 100 units/mL (25000 units/28250mL sodium chloride 0.45%)  1,400 Units/hr Intravenous Continuous Stevphen RochesterJames L Ledford, RPH 14 mL/hr at 01/26/16 0232 1,400 Units/hr at 01/26/16 0232  . hydrALAZINE (APRESOLINE) injection 10 mg  10 mg Intravenous Q4H PRN Lavone Neriimothy S Opyd, MD      . insulin aspart (novoLOG) injection 0-20 Units  0-20 Units Subcutaneous Q4H Briscoe Deutscherimothy S Opyd, MD   15 Units at 01/26/16 0448  . MEDLINE mouth rinse  15 mL Mouth Rinse q12n4p Rolly SalterPranav M Patel, MD      . morphine 4 MG/ML injection 1-3 mg  1-3 mg Intravenous Q3H PRN Briscoe Deutscherimothy S Opyd, MD   1 mg at 01/26/16 0737  . morphine 4 MG/ML injection 4 mg  4 mg Intravenous Once Shaune Pollackameron Isaacs, MD      . nitroGLYCERIN (NITROSTAT) SL tablet 0.4 mg  0.4 mg Sublingual Q5 min PRN Briscoe Deutscherimothy S Opyd, MD      . omega-3 acid ethyl esters (LOVAZA) capsule 1 g  1 g Oral BID Lavone Neriimothy S Opyd, MD      . ondansetron (ZOFRAN) tablet 4 mg  4 mg Oral Q6H PRN Briscoe Deutscherimothy S Opyd, MD       Or  . ondansetron (ZOFRAN) injection 4 mg  4 mg Intravenous Q6H PRN Briscoe Deutscherimothy S Opyd, MD      . oxymetazoline (AFRIN) 0.05 % nasal spray 1 spray  1 spray Each Nare BID Bobette Moavid Manuel Ortiz, MD      . polyethylene glycol (MIRALAX / GLYCOLAX) packet 17 g  17 g Oral Daily PRN Briscoe Deutscherimothy S Opyd, MD      . potassium chloride SA (K-DUR,KLOR-CON) CR tablet 20 mEq  20 mEq Oral Daily Briscoe Deutscherimothy S Opyd, MD       No current facility-administered medications on file prior to encounter.    Current Outpatient Prescriptions on File Prior to Encounter  Medication Sig Dispense Refill  . albuterol (PROVENTIL HFA;VENTOLIN HFA) 108 (90 Base) MCG/ACT inhaler Inhale 2 puffs into the lungs every 6 (six) hours as needed for wheezing or shortness of breath.     .Marland Kitchen  amiodarone (PACERONE) 400 MG tablet Take 400 mg by mouth daily.    Marland Kitchen amitriptyline (ELAVIL) 25  MG tablet Take 25 mg by mouth at bedtime as needed for sleep.     Marland Kitchen apixaban (ELIQUIS) 5 MG TABS tablet Take 5 mg by mouth 2 (two) times daily.    Marland Kitchen aspirin EC 81 MG tablet Take 81 mg by mouth daily.    Marland Kitchen atorvastatin (LIPITOR) 80 MG tablet Take 80 mg by mouth every morning.     . carvedilol (COREG) 6.25 MG tablet Take 6.25 mg by mouth 2 (two) times daily with a meal.    . clonazePAM (KLONOPIN) 1 MG tablet Take 1 mg by mouth 2 (two) times daily.     . famotidine (PEPCID) 20 MG tablet Take 20 mg by mouth 2 (two) times daily.    . furosemide (LASIX) 40 MG tablet Take 120 mg by mouth 2 (two) times daily.     Marland Kitchen lisinopril (PRINIVIL,ZESTRIL) 5 MG tablet Take 5 mg by mouth daily.    . metFORMIN (GLUCOPHAGE) 1000 MG tablet Take 1,000 mg by mouth 2 (two) times daily with a meal.    . nitroGLYCERIN (NITROSTAT) 0.4 MG SL tablet Place 0.4 mg under the tongue every 5 (five) minutes as needed for chest pain.     Marland Kitchen nystatin-triamcinolone (MYCOLOG II) cream Apply 1 application topically 2 (two) times daily as needed (rash).     . Omega-3 Fatty Acids (FISH OIL) 1200 MG CAPS Take 1,200 mg by mouth 2 (two) times daily. Per medication list patient to take 1200 mg  2 (two) oral two times daily    . potassium chloride (KLOR-CON) 20 MEQ packet Take 20 mEq by mouth daily.       ROS:   General:  No weight loss, Fever, chills  HEENT: No recent headaches, no nasal bleeding, no visual changes, no sore throat  Neurologic: No dizziness, blackouts, seizures. No recent symptoms of stroke or mini- stroke. No recent episodes of slurred speech, or temporary blindness.  Cardiac: No recent episodes of chest pain/pressure, no shortness of breath at rest.  + shortness of breath with exertion.  Denies history of atrial fibrillation or irregular heartbeat  Vascular: No history of rest pain in feet.  + history of claudication.  No history of non-healing ulcer, No history of DVT   Pulmonary: No home oxygen, no productive cough,  no hemoptysis,  No asthma or wheezing  Musculoskeletal:  [ ]  Arthritis, [ ]  Low back pain,  [ ]  Joint pain  Hematologic:No history of hypercoagulable state.  No history of easy bleeding.  No history of anemia  Gastrointestinal: No hematochezia or melena,  No gastroesophageal reflux, no trouble swallowing  Urinary: [ ]  chronic Kidney disease, [ ]  on HD - [ ]  MWF or [ ]  TTHS, [ ]  Burning with urination, [ ]  Frequent urination, [ ]  Difficulty urinating;   Skin: No rashes  Psychological: No history of anxiety,  No history of depression   Physical Examination  Vitals:   01/26/16 0130 01/26/16 0145 01/26/16 0226 01/26/16 0429  BP: 104/55 120/67 (!) 123/59 110/60  Pulse: 65 66 62 66  Resp: 15 23 19 10   Temp:   97.6 F (36.4 C) 97.9 F (36.6 C)  TempSrc:   Oral Oral  SpO2: 96% 98% 98% 98%    There is no height or weight on file to calculate BMI.  General:  Alert and oriented, no acute distress HEENT: Normal Neck: No JVD  Pulmonary: Clear to auscultation bilaterally Cardiac: Regular Rate and Rhythm  Abdomen: Soft, non-tender, non-distended, no mass, obese Skin: No rash or ulcer Extremity Pulses:  2+ radial, brachial, 1+ femoral, absent popliteal dorsalis pedis, posterior tibial pulses bilaterally Musculoskeletal: No deformity trace pretibial edema bilaterally  Neurologic: Upper and lower extremity motor 5/5 and symmetric  DATA:  CBC    Component Value Date/Time   WBC 12.7 (H) 01/25/2016 1936   RBC 5.25 01/25/2016 1936   HGB 16.8 01/25/2016 1936   HCT 47.4 01/25/2016 1936   PLT 218 01/25/2016 1936   MCV 90.3 01/25/2016 1936   MCH 32.0 01/25/2016 1936   MCHC 35.4 01/25/2016 1936   RDW 13.5 01/25/2016 1936   LYMPHSABS 3.1 01/25/2016 1936   MONOABS 0.9 01/25/2016 1936   EOSABS 0.4 01/25/2016 1936   BASOSABS 0.1 01/25/2016 1936    BMET    Component Value Date/Time   NA 130 (L) 01/25/2016 1936   K 3.5 01/25/2016 1936   CL 92 (L) 01/25/2016 1936   CO2 24 01/25/2016  1936   GLUCOSE 401 (H) 01/25/2016 1936   BUN 13 01/25/2016 1936   CREATININE 1.01 01/25/2016 1936   CALCIUM 8.7 (L) 01/25/2016 1936   GFRNONAA >60 01/25/2016 1936   GFRAA >60 01/25/2016 1936     ASSESSMENT:  Bilateral lower extremity claudication progressively slowly worse.  No rest pain or tissue loss to suggest pending limb loss.  Pain symptoms currently in anterior thigh and leg while sitting are not really consistant with PAD and may have other etiology.  His chronic walking symptoms however are consistent but not urgent or emergent.  CT shows iliac femoral occlusive disease.  Leg swelling and pain could be DVT especially with his sedentary lifestyle  Doubt rhabdo but with muscle pain increased lactate will at least check CPK to see if primary muscle problem   PLAN:  1.  Bilateral ABI                2. Bilateral DVT US                3. Continue heparin, most likely arteriogram Wednesday by Dr Randie Heinz               4. Ok to resume diet from my standpoint                 5. Poor diabetes control, will leave adjustments to primary team                6. Hyponatremia per primary team                7. Leukocytosis ? Acute  Phase, with lactate elevation again probably not related to PAD will leave workup to primary team   Fabienne Bruns, MD [facility] Office: [phone] Pager: [phone]

## 2016-01-26 NOTE — H&P (Signed)
History and Physical    Marcus Glass ZOX:[id number] DOB: 1960/11/30 DOA: 01/25/2016  PCP: Paulina Fusi, MD   Patient coming from: Home  Chief Complaint: Intractable pain in b/l LE's   HPI: Marcus Glass is a 56 y.o. male with medical history significant for type 2 diabetes mellitus, paroxysmal atrial fibrillation, coronary artery disease, peripheral arterial disease, and chronic diastolic CHF who presents to the emergency department for evaluation of intractable bilateral lower extremity pain. Patient reports suffering from bilateral lower extremity pain for months, but notes that it has been progressively worsening and has become so severe that he has been unable to get out of bed for a few days. He reports development of a sore on his buttock because of this. Patient describes his symptoms as initially occurring with activity and resolving with rest, but has more recently been experiencing severe pain even at rest. Symptoms may be slightly better if he allows his legs to hang dependently. But has not been able to engage in any activity in a few days due to the resulting increase in his severe pain. Patient denies any recent fevers or chills, denies chest pain or palpitations, denies dyspnea or cough, and denies abdominal pain, nausea, vomiting, or diarrhea. He has not attempted any interventions for his symptoms prior to coming in.  ED Course: Upon arrival to the ED, patient is found to be afebrile, saturating adequately on room air, and with vitals otherwise stable. EKG features a normal sinus rhythm. Chemistry panels notable for a hyponatremia to 1030 and hyperglycemia to 401. CBC was notable for mild leukocytosis to 12,700. Lactic acid was obtained and returns elevated to 2.92. CTA aortogram with bilateral iliofemoral runoff is notable for severe atherosclerotic disease causing severe stenosis at the bilateral superficial femoral artery origins and near occlusion at the bilateral common  iliacs. Vascular surgery was consulted by the ED physician and advised a medical admission, transitioning his Eliquis to heparin infusion, and indicates plans for vascular to evaluate the patient in the morning. Patient has remained hemodynamically stable in the ED and will be admitted to medical/surgical unit for ongoing evaluation and management of severe intractable bilateral lower extremity pain suspected secondary to severe progressive peripheral arterial disease.  Review of Systems:  All other systems reviewed and apart from HPI, are negative.  Past Medical History:  Diagnosis Date  . Acute ST-segment elevation myocardial infarction (HCC)   . Anxiety   . Bilateral leg edema   . Bilateral leg edema   . CAD (coronary artery disease)   . CHF (congestive heart failure) (HCC)   . Chronic venous insufficiency   . Clinical depression   . Diabetes mellitus without complication (HCC)   . Falling episodes   . Hyperlipidemia   . Hypertension   . Hypokalemia   . Left ventricular systolic dysfunction   . Paroxysmal atrial fibrillation (HCC)   . Sternum pain     Past Surgical History:  Procedure Laterality Date  . APPENDECTOMY    . CORONARY ANGIOPLASTY  01/2012  . CORONARY ARTERY BYPASS GRAFT    . HERNIA REPAIR       reports that he has been smoking Cigarettes.  He has smoked for the past 25.00 years. He has never used smokeless tobacco. He reports that he drinks alcohol. He reports that he does not use drugs.  No Known Allergies  Family History  Problem Relation Age of Onset  . Heart disease Mother   . Hyperlipidemia Mother   . Hypertension Mother   .  Cancer Father     lung and prostate   . Diabetes Father      Prior to Admission medications   Medication Sig Start Date End Date Taking? Authorizing Provider  albuterol (PROVENTIL HFA;VENTOLIN HFA) 108 (90 Base) MCG/ACT inhaler Inhale 2 puffs into the lungs every 6 (six) hours as needed for wheezing or shortness of breath.     Yes Historical Provider, MD  amiodarone (PACERONE) 400 MG tablet Take 400 mg by mouth daily.   Yes Historical Provider, MD  amitriptyline (ELAVIL) 25 MG tablet Take 25 mg by mouth at bedtime as needed for sleep.    Yes Historical Provider, MD  apixaban (ELIQUIS) 5 MG TABS tablet Take 5 mg by mouth 2 (two) times daily.   Yes Historical Provider, MD  aspirin EC 81 MG tablet Take 81 mg by mouth daily.   Yes Historical Provider, MD  atorvastatin (LIPITOR) 80 MG tablet Take 80 mg by mouth every morning.    Yes Historical Provider, MD  carvedilol (COREG) 6.25 MG tablet Take 6.25 mg by mouth 2 (two) times daily with a meal.   Yes Historical Provider, MD  clonazePAM (KLONOPIN) 1 MG tablet Take 1 mg by mouth 2 (two) times daily.    Yes Historical Provider, MD  famotidine (PEPCID) 20 MG tablet Take 20 mg by mouth 2 (two) times daily.   Yes Historical Provider, MD  furosemide (LASIX) 40 MG tablet Take 120 mg by mouth 2 (two) times daily.    Yes Historical Provider, MD  lisinopril (PRINIVIL,ZESTRIL) 5 MG tablet Take 5 mg by mouth daily.   Yes Historical Provider, MD  metFORMIN (GLUCOPHAGE) 1000 MG tablet Take 1,000 mg by mouth 2 (two) times daily with a meal.   Yes Historical Provider, MD  nitroGLYCERIN (NITROSTAT) 0.4 MG SL tablet Place 0.4 mg under the tongue every 5 (five) minutes as needed for chest pain.    Yes Historical Provider, MD  nystatin-triamcinolone (MYCOLOG II) cream Apply 1 application topically 2 (two) times daily as needed (rash).    Yes Historical Provider, MD  Omega-3 Fatty Acids (FISH OIL) 1200 MG CAPS Take 1,200 mg by mouth 2 (two) times daily. Per medication list patient to take 1200 mg  2 (two) oral two times daily   Yes Historical Provider, MD  potassium chloride (KLOR-CON) 20 MEQ packet Take 20 mEq by mouth daily.   Yes Historical Provider, MD    Physical Exam: Vitals:   01/25/16 2345 01/26/16 0000 01/26/16 0015 01/26/16 0100  BP: 106/77 121/67 116/69 118/73  Pulse: 64 63 61 65    Resp: 16 15 14 22   Temp:      TempSrc:      SpO2: 98% 97% 100% 99%      Constitutional: No respiratory distress, calm, appears uncomfortable Eyes: PERTLA, lids and conjunctivae normal ENMT: Mucous membranes are moist. Posterior pharynx clear of any exudate or lesions.   Neck: normal, supple, no masses, no thyromegaly Respiratory: clear to auscultation bilaterally, no wheezing, no crackles. Normal respiratory effort.  Cardiovascular: S1 & S2 heard, regular rate and rhythm. Trace pretibial edema b/l. Trace pedal pulses bilaterally; feet pink and warm with brisk cap refill. No significant JVD. Abdomen: No distension, no tenderness, no masses palpated. Bowel sounds normal.  Musculoskeletal: no clubbing / cyanosis. No joint deformity upper and lower extremities. Normal muscle tone.  Skin: Erythema and maceration at buttocks. Skin otherwise warm, dry, well-perfused. Neurologic: CN 2-12 grossly intact. Sensation intact, DTR normal. Strength 5/5 in all  4 limbs.  Psychiatric: Normal judgment and insight. Alert and oriented x 3. Normal mood and affect.     Labs on Admission: I have personally reviewed following labs and imaging studies  CBC:  Recent Labs Lab 01/25/16 1936  WBC 12.7*  NEUTROABS 8.2*  HGB 16.8  HCT 47.4  MCV 90.3  PLT 218   Basic Metabolic Panel:  Recent Labs Lab 01/25/16 1936  NA 130*  K 3.5  CL 92*  CO2 24  GLUCOSE 401*  BUN 13  CREATININE 1.01  CALCIUM 8.7*   GFR: CrCl cannot be calculated (Unknown ideal weight.). Liver Function Tests:  Recent Labs Lab 01/25/16 1936  AST 30  ALT 29  ALKPHOS 110  BILITOT 1.0  PROT 6.6  ALBUMIN 4.0   No results for input(s): LIPASE, AMYLASE in the last 168 hours. No results for input(s): AMMONIA in the last 168 hours. Coagulation Profile:  Recent Labs Lab 01/25/16 1936  INR 1.05   Cardiac Enzymes: No results for input(s): CKTOTAL, CKMB, CKMBINDEX, TROPONINI in the last 168 hours. BNP (last 3  results) No results for input(s): PROBNP in the last 8760 hours. HbA1C: No results for input(s): HGBA1C in the last 72 hours. CBG: No results for input(s): GLUCAP in the last 168 hours. Lipid Profile: No results for input(s): CHOL, HDL, LDLCALC, TRIG, CHOLHDL, LDLDIRECT in the last 72 hours. Thyroid Function Tests: No results for input(s): TSH, T4TOTAL, FREET4, T3FREE, THYROIDAB in the last 72 hours. Anemia Panel: No results for input(s): VITAMINB12, FOLATE, FERRITIN, TIBC, IRON, RETICCTPCT in the last 72 hours. Urine analysis: No results found for: COLORURINE, APPEARANCEUR, LABSPEC, PHURINE, GLUCOSEU, HGBUR, BILIRUBINUR, KETONESUR, PROTEINUR, UROBILINOGEN, NITRITE, LEUKOCYTESUR Sepsis Labs: @LABRCNTIP (procalcitonin:4,lacticidven:4) )No results found for this or any previous visit (from the past 240 hour(s)).   Radiological Exams on Admission: Ct Angio Aortobifemoral W And/or Wo Contrast  Result Date: 01/25/2016 CLINICAL DATA:  56 y/o M; 56 y/o M; chest pain and hip pain down to the toes with history of blood flow problems to the legs in the past. EXAM: CT ANGIOGRAPHY OF ABDOMINAL AORTA WITH ILIOFEMORAL RUNOFF TECHNIQUE: Multidetector CT imaging of the abdomen, pelvis and lower extremities was performed using the standard protocol during bolus administration of intravenous contrast. Multiplanar CT image reconstructions and MIPs were obtained to evaluate the vascular anatomy. CONTRAST:  100 cc Isovue 370 COMPARISON:  None. FINDINGS: VASCULAR Aorta: Normal caliber aorta without aneurysm, dissection, vasculitis or significant stenosis. Severe aortic atherosclerosis with extensive shaggy fibrofatty plaque. Celiac: Calcified plaque of celiac artery origin with moderate 50% stenosis (sagittal image 110). SMA: Patent without evidence of aneurysm, dissection, vasculitis or significant stenosis. Renals: Both renal arteries are patent without evidence of aneurysm, dissection, vasculitis, fibromuscular  dysplasia or significant stenosis. There is an accessory left renal artery with severe stenosis of the origin due to fibrofatty plaque (series 501, image 84). IMA: Patent with severe stenosis of the origin due to fibrofatty plaque (series 507, image 112). RIGHT Lower Extremity Inflow: Common, internal and external iliac arteries are patent without evidence of aneurysm, dissection, or vasculitis. There is severe calcific and fibrofatty plaque throughout the common, internal, and external iliac artery's. Short segment of mild-to-moderate stenosis of right proximal common iliac artery (series 501, image 117). Mild stenosis of proximal right external iliac artery. Severe thread-like patency of proximal bilateral internal iliac arteries. Outflow: Common, superficial and profunda femoral arteries and the popliteal artery are patent without evidence of aneurysm, dissection, or vasculitis. Severe stenosis of right superficial femoral artery  origin at the bifurcation (series 501, image 211) and multiple segments of mild-to-moderate stenosis throughout its course. Moderate stenosis of the profunda femoral origin (series 507, image 61). Mild bilateral popliteal artery stenosis. Runoff: Patent three vessel runoff to the ankle. LEFT Lower Extremity Inflow: Common, internal and external iliac arteries are patent without evidence of aneurysm, dissection, or vasculitis. Mild diffuse stenosis of left common iliac artery. Long segment of moderate stenosis of left proximal external iliac artery. Severe stenosis thread-like patency of proximal bilateral internal iliac arteries. Outflow: Common, superficial and profunda femoral arteries and the popliteal artery are patent without evidence of aneurysm, dissection, or vasculitis. Severe stenosis/near occlusion of left superficial femoral artery origin (series 501, image 212), multiple segments of mild-to-moderate stenosis throughout the vessel course and single short segment of severe  stenosis within the mid thigh (series 501 image 282 and series 507, image 133). Severe stenosis of the left profunda femoral origin (series 507, image 141). Mild bilateral popliteal artery stenosis. Runoff: Patent three vessel runoff to the ankle. Veins: No obvious venous abnormality within the limitations of this arterial phase study. Review of the MIP images confirms the above findings. NON-VASCULAR Lower chest: No acute abnormality. Hepatobiliary: No focal liver abnormality is seen. No gallstones, gallbladder wall thickening, or biliary dilatation. Pancreas: Unremarkable. No pancreatic ductal dilatation or surrounding inflammatory changes. Spleen: Normal in size without focal abnormality. Adrenals/Urinary Tract: Adrenal glands are unremarkable. Kidneys are normal, without renal calculi, focal lesion, or hydronephrosis. Bladder is unremarkable. Stomach/Bowel: Stomach is within normal limits. Appendix appears normal. No evidence of bowel wall thickening, distention, or inflammatory changes. Lymphatic: No lymphadenopathy. Reproductive: Prostate is unremarkable. Other: No abdominal wall hernia or abnormality. No abdominopelvic ascites. Musculoskeletal: Mild multilevel spinal spondylosis with lower lumbar facet arthropathy. Mild osteoarthrosis of the hip joints bilaterally with acetabular productive changes. IMPRESSION: VASCULAR 1. Severe aortic atherosclerosis with extensive shaggy fibrofatty plaque. No high-grade stenosis or aneurysm. 2. Patent three-vessel runoff bilaterally to the ankles. 3. Multiple levels of mild-to-moderate stenosis throughout the iliac, femoral, and popliteal arteries. 4. Severe stenosis of bilateral superficial femoral artery origins greater on the left where there is near occlusion. Additional short segment of severe stenosis of left superficial femoral artery in the mid thigh. 5. Moderate stenosis of right profunda femoral origin and severe stenosis of left profunda femoral origin. 6.  Severe stenosis/near occlusion of proximal common iliac artery origins bilaterally. 7. Moderate stenosis of celiac artery origin. 8. Severe stenosis of left accessory renal artery origin. 9. Severe stenosis of IMA origin. NON-VASCULAR No acute process of abdomen or pelvis identified. Electronically Signed   By: Mitzi Hansen M.D.   On: 01/25/2016 23:17    EKG: Independently reviewed. Normal sinus rhythm  Assessment/Plan  1. PAD with intractable leg pain  - Pt gives a long hx of progressive BLE pain with activity consistent with claudication - Over recent weeks, he has developed intermittent pain with rest, now becoming severe and longer-lasting  - He has become essentially bed-bound d/t this and has developed a bed sore on buttock  - CTA with bifemoral runoff obtained in ED and notable for severe stenoses/near occlusions bilaterally at multiple levels  - Vascular surgery is consulting and much appreciated; will follow-up on recommendations  - Eliquis held, heparin infusion started   2. Atrial fibrillation, paroxysmal  - Sinus rhythm on presentation  - CHADS-VASc is 37 (age, CAD/PAD, HTN, DM) - Eliquis held, heparin infusion started per vascular recommendation - Continue beta-blocker as tolerated   3.  CAD  - Hx of CABG  - No anginal complaints  - Continue Coreg, Lipitor, and Lovaza    4. Chronic diastolic CHF  - Appears roughly euvolemic on admission  - TTE (07/13/14) with 40-45%, mild concentric LVH, apical HK  - Continue Coreg and Lasix and tolerated  - Follow I/O's and daily wts   5. Type II DM  - No A1c on file; serum glucose 401 on presentation  - Managed with metformin only at home; this is held on admission  - Check CBG with meals and qHS  - Start with a high-intensity sliding-scale correctional and adjust prn    6. Bed sore  - Pt has been essentially bed-bound from severe BLE pain  - He reports recent development of a wound on buttock - Appears to be macerated  with some skin breakdown  - Requested a wound care consultation for recommendations    DVT prophylaxis: IV heparin infusion  Code Status: Full  Family Communication: Friend updated at bedside with patient's permission  Disposition Plan: Admit to med-surg Consults called: Vascular surgery Admission status: Inpatient    Briscoe Deutscher, MD [facility] Pager [phone]  If 7PM-7AM, please contact night-coverage [email or web address] Password [email or web address]  01/26/2016, 1:35 AM

## 2016-01-26 NOTE — Progress Notes (Signed)
VASCULAR LAB PRELIMINARY  PRELIMINARY  PRELIMINARY  PRELIMINARY  Bilateral lower extremity venous duplex completed.    Preliminary report:  Bilateral:  No evidence of DVT, superficial thrombosis, or Baker's Cyst.   Tremon Sainvil, RVS 01/26/2016, 12:28 PM

## 2016-01-26 NOTE — Progress Notes (Signed)
[facility] PLAN OF CARE NOTE Patient: Loleta RoseWilliam Rossner ZOX:[id number]   PCP: Paulina FusiSCHULTZ,DOUGLAS E, MD DOB: 06/09/1960   DOA: 01/25/2016   DOS: 01/26/2016    Patient was admitted by my colleague Dr. Antionette Charpyd earlier on 01/26/2016. I have reviewed the H&P as well as assessment and plan and agree with the same. Important changes in the plan are listed below.  Plan of care: Principal Problem:   Intractable pain Active Problems:   Vascular claudication (HCC)   PAD (peripheral artery disease) (HCC)   Bed sore on buttock   Diabetes mellitus type II, non insulin dependent (HCC)   CAD (coronary artery disease)   PAF (paroxysmal atrial fibrillation) (HCC)   Hyponatremia   Chronic diastolic CHF (congestive heart failure) (HCC) amiodarone correct dose per pt is 200 mg daily, changed Stop IV fluids, cut lasix home dose from 120mg  bid to 60 mg bid. Continue resistant sliding scale   Author: Lynden OxfordPranav Johann Santone, MD [facility] Pager: [phone] 01/26/2016 3:51 PM   If 7PM-7AM, please contact night-coverage at [email or web address], password [email or web address]

## 2016-01-26 NOTE — ED Notes (Signed)
Pt and family made aware of bed assignment 

## 2016-01-27 LAB — GLUCOSE, CAPILLARY
GLUCOSE-CAPILLARY: 222 mg/dL — AB (ref 65–99)
Glucose-Capillary: 262 mg/dL — ABNORMAL HIGH (ref 65–99)
Glucose-Capillary: 263 mg/dL — ABNORMAL HIGH (ref 65–99)
Glucose-Capillary: 267 mg/dL — ABNORMAL HIGH (ref 65–99)
Glucose-Capillary: 344 mg/dL — ABNORMAL HIGH (ref 65–99)

## 2016-01-27 LAB — BASIC METABOLIC PANEL
ANION GAP: 9 (ref 5–15)
BUN: 9 mg/dL (ref 6–20)
CALCIUM: 8.7 mg/dL — AB (ref 8.9–10.3)
CO2: 25 mmol/L (ref 22–32)
Chloride: 100 mmol/L — ABNORMAL LOW (ref 101–111)
Creatinine, Ser: 0.91 mg/dL (ref 0.61–1.24)
GFR calc Af Amer: >60 mL/min (ref >60)
Glucose, Bld: 250 mg/dL — ABNORMAL HIGH (ref 65–99)
POTASSIUM: 3.2 mmol/L — AB (ref 3.5–5.1)
Sodium: 134 mmol/L — ABNORMAL LOW (ref 135–145)

## 2016-01-27 LAB — CBC
HCT: 43.6 % (ref 39.0–52.0)
Hemoglobin: 15.5 g/dL (ref 13.0–17.0)
MCH: 32.2 pg (ref 26.0–34.0)
MCHC: 35.6 g/dL (ref 30.0–36.0)
MCV: 90.5 fL (ref 78.0–100.0)
PLATELETS: 183 10*3/uL (ref 150–400)
RBC: 4.82 MIL/uL (ref 4.22–5.81)
RDW: 13.5 % (ref 11.5–15.5)
WBC: 9.6 10*3/uL (ref 4.0–10.5)

## 2016-01-27 LAB — HEMOGLOBIN A1C
Hgb A1c MFr Bld: 13 % — ABNORMAL HIGH (ref 4.8–5.6)
MEAN PLASMA GLUCOSE: 326 mg/dL

## 2016-01-27 LAB — APTT: APTT: 96 s — AB (ref 24–36)

## 2016-01-27 LAB — HEPARIN LEVEL (UNFRACTIONATED): Heparin Unfractionated: 0.71 IU/mL — ABNORMAL HIGH (ref 0.30–0.70)

## 2016-01-27 MED ORDER — INSULIN ASPART 100 UNIT/ML ~~LOC~~ SOLN
0.0000 [IU] | Freq: Three times a day (TID) | SUBCUTANEOUS | Status: DC
  Administered 2016-01-27 – 2016-01-28 (×2): 8 [IU] via SUBCUTANEOUS
  Administered 2016-01-28: 11 [IU] via SUBCUTANEOUS

## 2016-01-27 MED ORDER — INSULIN ASPART 100 UNIT/ML ~~LOC~~ SOLN
0.0000 [IU] | Freq: Every day | SUBCUTANEOUS | Status: DC
  Administered 2016-01-27: 4 [IU] via SUBCUTANEOUS

## 2016-01-27 MED ORDER — POTASSIUM CHLORIDE CRYS ER 20 MEQ PO TBCR
40.0000 meq | EXTENDED_RELEASE_TABLET | Freq: Every day | ORAL | Status: DC
  Administered 2016-01-27 – 2016-02-03 (×6): 40 meq via ORAL
  Filled 2016-01-27 (×6): qty 2

## 2016-01-27 MED ORDER — FUROSEMIDE 80 MG PO TABS
120.0000 mg | ORAL_TABLET | Freq: Two times a day (BID) | ORAL | Status: DC
  Administered 2016-01-27: 120 mg via ORAL
  Filled 2016-01-27: qty 1

## 2016-01-27 MED ORDER — INSULIN GLARGINE 100 UNIT/ML ~~LOC~~ SOLN
22.0000 [IU] | Freq: Every day | SUBCUTANEOUS | Status: DC
  Administered 2016-01-27 – 2016-01-28 (×2): 22 [IU] via SUBCUTANEOUS
  Filled 2016-01-27 (×3): qty 0.22

## 2016-01-27 NOTE — Consult Note (Addendum)
WOC Nurse wound consult note Reason for Consult: Consult requested for buttocks.  Pt states he had a wound there "for a few weeks prior to admission." Wound type: Partial thickness wound to left buttock inner area near gluteal fold. Pressure Injury POA: This was present on admission and is NOT a pressure injury; it is not located over a bony prominence and the appearance is not typical for a pressure injury.. Measurement: .5X.5X.1cm, pink and dry Drainage (amount, consistency, odor) No odor or drainage Periwound: Intact skin surrounding Dressing procedure/placement/frequency: Foam dressing to protect and promote healing and reduce shear when pulling up in bed.  Discussed plan of care with patient and he verbalized understanding. Please re-consult if further assistance is needed.  Thank-you,  Cammie Mcgeeawn Bennie Scaff MSN, RN, CWOCN, [place]AP, CNS [phone]

## 2016-01-27 NOTE — Progress Notes (Signed)
[facility] Progress Note  Patient: Marcus RoseWilliam Calleros [id number]   PCP: Paulina FusiSCHULTZ,DOUGLAS E, MD DOB: 04/08/1960   DOA: 01/25/2016   DOS: 01/27/2016   Date of Service: the patient was seen and examined on 01/27/2016  Brief hospital course: Pt. with PMH of Uncontrolled type II DM, A. fib, CAD, PVD, CHF; admitted on 01/25/2016, with complaint of severe bilateral leg pain, was found to have PVD with stenosis in the lower extremities. Currently further plan is supportive management and lower extremity angiogram on Wednesday.  Assessment and Plan: 1. Peripheral vascular disease. Patient wasn't treated with claudication-like symptoms. CT shows iliac femoral occlusive disease. ABI 0.78 right, 0.6 for left. Vascular surgery consulted. Patient on IV heparin infusion. Scheduled for angiogram on Wednesday. Pain getting better at present on current regimen.  2. Uncontrolled type 2 diabetes mellitus. Hemoglobin A1c currently pending, last A1c was 12. Per recommendation from diabetes coordinator, patient will be started on Lantus 22 units daily along with moderate sliding scale. Will monitor.  3. Chronic diastolic dysfunction. At present appears euvolemic. Patient was started on IV fluids on admission. Takes 120 mg of Lasix twice a day at home. At present we will resume home regimen for today and starting tomorrow afternoon reduced regimen to have The patient ready for angiogram and stop it on the day of the procedure. Following the procedure recommend to monitor on a daily basis and initiate as and when needed. Continue Stocking.  4. History of A. fib. CHADS-VASc is 4 Patient on Apixaban at home, currently on heparin Rate controlled. Continue amiodarone home dose 200 mg daily. Continue Coreg.  5. Hypokalemia. Continue to monitor. Replacing.  6. COPD. On 81 mg aspirin at home. Currently on hold. May require some sort of antiplatelet medication with his CAD, PVD.  7. Pressure Ulcer  on buttocks. Present on admission Foam dressing.  Bowel regimen: last BM prior to admission Diet: Carb modified cardiac diet DVT Prophylaxis: on therapeutic anticoagulation.  Advance goals of care discussion: full code  Family Communication: no family was present at bedside, at the time of interview.  Disposition:  Discharge to home. Expected discharge date: 01/29/2016,   Consultants: vascular surgery Procedures: ABI  Antibiotics: Anti-infectives    None        Subjective: feeling better, no nausea or vomiting.   Objective: Physical Exam: Vitals:   01/26/16 1450 01/26/16 2003 01/27/16 0404 01/27/16 1321  BP: 103/60 125/62 112/67 (!) 115/58  Pulse: (!) 58 62 67 (!) 56  Resp: 16 19 19    Temp: 98.1 F (36.7 C) 98.2 F (36.8 C) 97.6 F (36.4 C) 98.2 F (36.8 C)  TempSrc: Oral Oral Oral Oral  SpO2: 98% 98% 98% 99%  Weight:      Height:        Intake/Output Summary (Last 24 hours) at 01/27/16 1739 Last data filed at 01/27/16 1100  Gross per 24 hour  Intake             1060 ml  Output              400 ml  Net              660 ml   Filed Weights   01/26/16 0900  Weight: 112.5 kg (248 lb)    General: Alert, Awake and Oriented to Time, Place and Person. Appear in mild distress, affect appropriate Eyes: PERRL, Conjunctiva normal ENT: Oral Mucosa clear moist Neck: no JVD, no Abnormal Mass Or lumps Cardiovascular: S1 and S2  Present, no Murmur, Respiratory: Bilateral Air entry equal and Decreased, no use of accessory muscle, Clear to Auscultation, no Crackles, no wheezes Abdomen: Bowel Sound present, Soft and no tenderness Skin: no redness, no Rash, no induration Extremities: bilateral Pedal edema, no calf tenderness Neurologic: Grossly no focal neuro deficit. Bilaterally Equal motor strength  Data Reviewed: CBC:  Recent Labs Lab 01/25/16 1936 01/27/16 0742  WBC 12.7* 9.6  NEUTROABS 8.2*  --   HGB 16.8 15.5  HCT 47.4 43.6  MCV 90.3 90.5  PLT 218 183    Basic Metabolic Panel:  Recent Labs Lab 01/25/16 1936 01/27/16 0742  NA 130* 134*  K 3.5 3.2*  CL 92* 100*  CO2 24 25  GLUCOSE 401* 250*  BUN 13 9  CREATININE 1.01 0.91  CALCIUM 8.7* 8.7*    Liver Function Tests:  Recent Labs Lab 01/25/16 1936  AST 30  ALT 29  ALKPHOS 110  BILITOT 1.0  PROT 6.6  ALBUMIN 4.0   No results for input(s): LIPASE, AMYLASE in the last 168 hours. No results for input(s): AMMONIA in the last 168 hours. Coagulation Profile:  Recent Labs Lab 01/25/16 1936  INR 1.05   Cardiac Enzymes:  Recent Labs Lab 01/26/16 0919  CKTOTAL 90   BNP (last 3 results) No results for input(s): PROBNP in the last 8760 hours.  CBG:  Recent Labs Lab 01/26/16 2358 01/27/16 0401 01/27/16 0748 01/27/16 1219 01/27/16 1659  GLUCAP 289* 262* 222* 263* 267*    Studies: No results found.   Scheduled Meds: . amiodarone  200 mg Oral Daily  . atorvastatin  80 mg Oral Daily  . carvedilol  6.25 mg Oral BID WC  . clonazePAM  1 mg Oral BID  . famotidine  20 mg Oral BID  . furosemide  120 mg Oral BID  . insulin aspart  0-15 Units Subcutaneous TID WC  . insulin aspart  0-5 Units Subcutaneous QHS  . insulin glargine  22 Units Subcutaneous Daily  . methocarbamol  500 mg Oral TID  . omega-3 acid ethyl esters  1 g Oral BID  . potassium chloride SA  40 mEq Oral Daily   Continuous Infusions: . heparin 1,800 Units/hr (01/27/16 0753)   PRN Meds: acetaminophen **OR** acetaminophen, albuterol, amitriptyline, bisacodyl, hydrALAZINE, HYDROcodone-acetaminophen, morphine injection, nitroGLYCERIN, ondansetron **OR** ondansetron (ZOFRAN) IV, polyethylene glycol  Time spent: 30 minutes  Author: Lynden Oxford, MD [facility] Pager: [phone] 01/27/2016 5:39 PM  If 7PM-7AM, please contact night-coverage at [email or web address], password [facility]

## 2016-01-27 NOTE — Progress Notes (Signed)
ANTICOAGULATION CONSULT NOTE - Follow Up Consult  Pharmacy Consult:  Heparin Indication: atrial fibrillation  No Known Allergies  Patient Measurements: Heparin Dosing Weight: 93.6 kg   Vital Signs: Temp: 97.6 F (36.4 C) (01/22 0404) Temp Source: Oral (01/22 0404) BP: 112/67 (01/22 0404) Pulse Rate: 67 (01/22 0404)  Labs:  Recent Labs  01/25/16 1936 01/26/16 0919 01/26/16 2028 01/27/16 0742  HGB 16.8  --   --  15.5  HCT 47.4  --   --  43.6  PLT 218  --   --  183  APTT  --  47* 64* 96*  LABPROT 13.7  --   --   --   INR 1.05  --   --   --   HEPARINUNFRC  --  0.77*  --  0.71*  CREATININE 1.01  --   --  0.91  CKTOTAL  --  90  --   --     Estimated Creatinine Clearance: 111.6 mL/min (by C-G formula based on SCr of 0.91 mg/dL).    Assessment: [age] YOM with history of Afib on Eliquis PTA.  Currently on IV heparin bridge in anticipation of vascular procedure.  Currently using aPTT to dose heparin given apixaban's influence on anti-Xa levels.    APTT is therapeutic and heparin level is slightly above goal.  No bleeding reported.   Goal of Therapy:  Heparin level 0.3-0.7 units/ml aPTT 66-102 seconds Monitor platelets by anticoagulation protocol: Yes    Plan:  - Continue heparin gtt at 1800 units/hr - Daily heparin level, aPTT, CBC - Consider adding meal coverage insulin and supplement KCL   Carel Schnee D. Laney Potashang, PharmD, BCPS Pager:  [phone] 01/27/2016, 10:46 AM

## 2016-01-27 NOTE — Progress Notes (Addendum)
Inpatient Diabetes Program Recommendations  AACE/ADA: New Consensus Statement on Inpatient Glycemic Control (2015)  Target Ranges:  Prepandial:   less than 140 mg/dL      Peak postprandial:   less than 180 mg/dL (1-2 hours)      Critically ill patients:  140 - 180 mg/dL   Lab Results  Component Value Date   GLUCAP 263 (H) 01/27/2016   HGBA1C 13.0 (H) 01/26/2016   Results for Marcus RoseAYLOR, Ferlando (MRN [id number]) as of 01/27/2016 12:56  Ref. Range 01/26/2016 07:30 01/26/2016 12:55 01/26/2016 16:10 01/26/2016 20:00 01/26/2016 23:58 01/27/2016 04:01 01/27/2016 07:48 01/27/2016 12:19  Glucose-Capillary Latest Ref Range: 65 - 99 mg/dL 409257 (H) 811199 (H) 914309 (H) 346 (H) 289 (H) 262 (H) 222 (H) 263 (H)   Review of Glycemic Control  Diabetes history: DM2, wound requiring WOC consult, Obesity Outpatient Diabetes medications: Metformin 1000 mg BID Current orders for Inpatient glycemic control: Novolog 0-20 units Q4H correction scale  Inpatient Diabetes Program Recommendations:    FBG's for last 2 mornings = 257 and 222.    Please consider Lantus 22 units (0.2 units/Kg * 112.5 Kg) daily starting today.    If start Lantus, please consider decreasing correction to moderate SSI of Novolog 0-15 units TIDAC and 0-5 units QHS.  Text page to MD sent.  Thank you,  Kristine LineaKaren Kyriaki Moder, RN, MSN Diabetes Coordinator Inpatient Diabetes Program [phone] (Team Pager)

## 2016-01-27 NOTE — Consult Note (Signed)
[facility] [place]  Subjective  - feels a little better   Objective (!) 115/58 (!) 56 98.2 F (36.8 C) (Oral) 19 99%  Intake/Output Summary (Last 24 hours) at 01/27/16 1512 Last data filed at 01/27/16 1100  Gross per 24 hour  Intake             1060 ml  Output              400 ml  Net              660 ml   Feet pink warm  Assessment/Planning: PAD explains his calf symptoms but told him that any intervention will probably not help the thigh/hip pain which is not related to his PAD  CK negative  Continue heparin  Pt on schedule for Wednesday morning agram  Hypokalemia/Hyperglycemia per primary team  Fabienne BrunsFields, Sallyanne Birkhead 01/27/2016 3:12 PM --  Laboratory Lab Results:  Recent Labs  01/25/16 1936 01/27/16 0742  WBC 12.7* 9.6  HGB 16.8 15.5  HCT 47.4 43.6  PLT 218 183   BMET  Recent Labs  01/25/16 1936 01/27/16 0742  NA 130* 134*  K 3.5 3.2*  CL 92* 100*  CO2 24 25  GLUCOSE 401* 250*  BUN 13 9  CREATININE 1.01 0.91  CALCIUM 8.7* 8.7*    COAG Lab Results  Component Value Date   INR 1.05 01/25/2016   No results found for: PTT

## 2016-01-28 LAB — BASIC METABOLIC PANEL
ANION GAP: 9 (ref 5–15)
BUN: 10 mg/dL (ref 6–20)
CO2: 28 mmol/L (ref 22–32)
Calcium: 8.5 mg/dL — ABNORMAL LOW (ref 8.9–10.3)
Chloride: 95 mmol/L — ABNORMAL LOW (ref 101–111)
Creatinine, Ser: 0.97 mg/dL (ref 0.61–1.24)
GFR calc Af Amer: >60 mL/min (ref >60)
GFR calc non Af Amer: >60 mL/min (ref >60)
GLUCOSE: 308 mg/dL — AB (ref 65–99)
POTASSIUM: 3.1 mmol/L — AB (ref 3.5–5.1)
Sodium: 132 mmol/L — ABNORMAL LOW (ref 135–145)

## 2016-01-28 LAB — CBC
HCT: 43.7 % (ref 39.0–52.0)
HEMOGLOBIN: 15.1 g/dL (ref 13.0–17.0)
MCH: 31.5 pg (ref 26.0–34.0)
MCHC: 34.6 g/dL (ref 30.0–36.0)
MCV: 91 fL (ref 78.0–100.0)
Platelets: 187 10*3/uL (ref 150–400)
RBC: 4.8 MIL/uL (ref 4.22–5.81)
RDW: 13.3 % (ref 11.5–15.5)
WBC: 9.3 10*3/uL (ref 4.0–10.5)

## 2016-01-28 LAB — GLUCOSE, CAPILLARY
GLUCOSE-CAPILLARY: 355 mg/dL — AB (ref 65–99)
GLUCOSE-CAPILLARY: 347 mg/dL — AB (ref 65–99)
Glucose-Capillary: 299 mg/dL — ABNORMAL HIGH (ref 65–99)
Glucose-Capillary: 341 mg/dL — ABNORMAL HIGH (ref 65–99)

## 2016-01-28 LAB — APTT: aPTT: 90 seconds — ABNORMAL HIGH (ref 24–36)

## 2016-01-28 LAB — HEPARIN LEVEL (UNFRACTIONATED): HEPARIN UNFRACTIONATED: 0.53 [IU]/mL (ref 0.30–0.70)

## 2016-01-28 MED ORDER — INSULIN ASPART 100 UNIT/ML ~~LOC~~ SOLN
0.0000 [IU] | Freq: Three times a day (TID) | SUBCUTANEOUS | Status: DC
  Administered 2016-01-28: 20 [IU] via SUBCUTANEOUS
  Administered 2016-01-29: 7 [IU] via SUBCUTANEOUS
  Administered 2016-01-29: 11 [IU] via SUBCUTANEOUS
  Administered 2016-01-30: 7 [IU] via SUBCUTANEOUS
  Administered 2016-01-30 – 2016-01-31 (×3): 11 [IU] via SUBCUTANEOUS
  Administered 2016-02-01: 7 [IU] via SUBCUTANEOUS
  Administered 2016-02-02: 4 [IU] via SUBCUTANEOUS
  Administered 2016-02-02: 7 [IU] via SUBCUTANEOUS
  Administered 2016-02-02 – 2016-02-03 (×3): 4 [IU] via SUBCUTANEOUS

## 2016-01-28 MED ORDER — POTASSIUM CHLORIDE CRYS ER 20 MEQ PO TBCR
40.0000 meq | EXTENDED_RELEASE_TABLET | Freq: Once | ORAL | Status: AC
  Administered 2016-01-28: 40 meq via ORAL
  Filled 2016-01-28: qty 2

## 2016-01-28 MED ORDER — INSULIN ASPART 100 UNIT/ML ~~LOC~~ SOLN
0.0000 [IU] | Freq: Every day | SUBCUTANEOUS | Status: DC
  Administered 2016-01-28: 4 [IU] via SUBCUTANEOUS
  Administered 2016-01-29 – 2016-01-30 (×2): 3 [IU] via SUBCUTANEOUS
  Administered 2016-01-31 – 2016-02-01 (×2): 2 [IU] via SUBCUTANEOUS

## 2016-01-28 MED ORDER — FUROSEMIDE 40 MG PO TABS
60.0000 mg | ORAL_TABLET | Freq: Two times a day (BID) | ORAL | Status: DC
  Administered 2016-01-28 – 2016-02-01 (×6): 60 mg via ORAL
  Filled 2016-01-28: qty 2
  Filled 2016-01-28 (×5): qty 1

## 2016-01-28 MED ORDER — CARVEDILOL 3.125 MG PO TABS
3.1250 mg | ORAL_TABLET | Freq: Two times a day (BID) | ORAL | Status: DC
  Administered 2016-01-28 – 2016-02-03 (×12): 3.125 mg via ORAL
  Filled 2016-01-28 (×12): qty 1

## 2016-01-28 NOTE — Progress Notes (Signed)
ANTICOAGULATION CONSULT NOTE - Follow Up Consult  Pharmacy Consult:  Heparin Indication: atrial fibrillation  No Known Allergies  Patient Measurements: Heparin Dosing Weight: 93.6 kg   Vital Signs: Temp: 98.1 F (36.7 C) (01/23 0458) Temp Source: Oral (01/23 0458) BP: 97/55 (01/23 0458) Pulse Rate: 57 (01/23 0458)  Labs:  Recent Labs  01/25/16 1936  01/26/16 0919 01/26/16 2028 01/27/16 0742 01/28/16 0438  HGB 16.8  --   --   --  15.5 15.1  HCT 47.4  --   --   --  43.6 43.7  PLT 218  --   --   --  183 187  APTT  --   < > 47* 64* 96* 90*  LABPROT 13.7  --   --   --   --   --   INR 1.05  --   --   --   --   --   HEPARINUNFRC  --   --  0.77*  --  0.71* 0.53  CREATININE 1.01  --   --   --  0.91 0.97  CKTOTAL  --   --  90  --   --   --   < > = values in this interval not displayed.  Estimated Creatinine Clearance: 104.7 mL/min (by C-G formula based on SCr of 0.97 mg/dL).    Assessment: [age] YOM with history of Afib on Eliquis PTA.  Currently on IV heparin bridge in anticipation of angiogram on Wednesday.  APTT and heparin level are both therapeutic and correlating.  No bleeding reported.   Goal of Therapy:  HL 0.3 - 0.7 units/mL Monitor platelets by anticoagulation protocol: Yes    Plan:  - Continue heparin gtt at 1800 units/hr - Daily heparin level, CBC - F/U CBGs, lytes - Consider adding hold parameters for Coreg   Bartholomew Ramesh D. Laney Potashang, PharmD, BCPS Pager:  [phone] 01/28/2016, 10:15 AM

## 2016-01-28 NOTE — Progress Notes (Signed)
  [facility] Progress Note  Plan for aortogram with b/l runoff possible intervention tomorrow with Dr. [name]. All questions were answered with wife present. NPO past midnight. [facility]   Maris BergerKimberly Trinh, PA-C [facility] Office: [phone] Pager: [phone] 01/28/2016 10:16 AM

## 2016-01-28 NOTE — Progress Notes (Addendum)
Inpatient Diabetes Program Recommendations  AACE/ADA: New Consensus Statement on Inpatient Glycemic Control (2015)  Target Ranges:  Prepandial:   less than 140 mg/dL      Peak postprandial:   less than 180 mg/dL (1-2 hours)      Critically ill patients:  140 - 180 mg/dL   Lab Results  Component Value Date   GLUCAP 299 (H) 01/28/2016   HGBA1C 13.0 (H) 01/26/2016   Results for Marcus Glass, Marcus Glass (MRN [id number]) as of 01/28/2016 10:21  Ref. Range 01/27/2016 07:48 01/27/2016 12:19 01/27/2016 16:59 01/27/2016 22:18 01/28/2016 07:30  Glucose-Capillary Latest Ref Range: 65 - 99 mg/dL 409222 (H) 811263 (H) 914267 (H) 344 (H) 299 (H)  Results for Marcus Glass, Marcus Glass (MRN [id number]) as of 01/28/2016 14:06  Ref. Range 01/28/2016 11:38  Glucose-Capillary Latest Ref Range: 65 - 99 mg/dL 086341 (H)    Current orders for Inpatient glycemic control:     Novolog 0-15 units TIDAC and 0-5 units QHS    Lantus 22 units daily  Inpatient Diabetes Program Recommendations:     Please consider increasing Lantus to 30 units daily.      Noted NPO after midnight tonight - please give half (15 units) of Lantus dose prior to NPO status is in effect.     Please consider adding Novolog 8 units TIDAC meal coverage if patient eats > 50% of meal (held when NPO).  Text page sent to MD.  Thank you,  Kristine LineaKaren Earlyne Feeser, RN, MSN Diabetes Coordinator Inpatient Diabetes Program [phone] (Team Pager)

## 2016-01-28 NOTE — Progress Notes (Addendum)
[facility] Progress Note  Patient: Marcus RoseWilliam Fadely [id number]   PCP: Paulina FusiSCHULTZ,DOUGLAS E, MD DOB: 06/16/1960   DOA: 01/25/2016   DOS: 01/28/2016   Date of Service: the patient was seen and examined on 01/28/2016  Brief hospital course: Pt. with PMH of Uncontrolled type II DM, A. fib, CAD, PVD, CHF; admitted on 01/25/2016, with complaint of severe bilateral leg pain, was found to have PVD with stenosis in the lower extremities. Currently further plan is supportive management and lower extremity angiogram on Wednesday.  Assessment and Plan: 1. Peripheral vascular disease. Patient wasn't treated with claudication-like symptoms. CT shows iliac femoral occlusive disease. ABI 0.78 right, 0.6 for left. Vascular surgery consulted. Patient on IV heparin infusion. Scheduled for angiogram on Wednesday. Pain getting better at present on current regimen.  2. Uncontrolled type 2 diabetes mellitus. Hemoglobin A1c currently 13, last A1c was 12. on Lantus 22 units daily along with moderate sliding scale. Since the patient going for the procedure tomorrow, I will increase the sliding scale to resistant and maintain 22 units at present, starting tomorrow he may require to 30 units of Lantus. Will monitor.  3. Chronic diastolic dysfunction. Hypertension, hypotension At present appears euvolemic. Patient was started on IV fluids on admission. Takes 120 mg of Lasix twice a day at home. At present continue 60 mg of twice a day Lasix. Stop on the day of the procedure. Resume Lasix postprocedure as and when needed. Continue Stocking. Reducing Coreg in half as well.  4. History of A. fib. CHADS-VASc is 4 Patient on Apixaban at home, currently on heparin Rate controlled. Continue amiodarone home dose 200 mg daily. Continue Coreg.  5. Hypokalemia. Continue to monitor. Replacing.  6. COPD. On 81 mg aspirin at home. Currently on hold. May require some sort of antiplatelet medication with his  CAD, PVD.  7. Pressure Ulcer on buttocks. Present on admission Foam dressing.  Bowel regimen: last BM 01/27/2016 Diet: Carb modified cardiac diet DVT Prophylaxis: on therapeutic anticoagulation.  Advance goals of care discussion: full code  Family Communication: no family was present at bedside, at the time of interview.  Disposition:  Discharge to home. Expected discharge date: 01/30/2016,   Consultants: vascular surgery Procedures: ABI  Antibiotics: Anti-infectives    None        Subjective: feeling better, no nausea or vomiting.   Objective: Physical Exam: Vitals:   01/27/16 0404 01/27/16 1321 01/28/16 0458 01/28/16 1358  BP: 112/67 (!) 115/58 (!) 97/55 (!) 109/58  Pulse: 67 (!) 56 (!) 57 (!) 57  Resp: 19  18 18   Temp: 97.6 F (36.4 C) 98.2 F (36.8 C) 98.1 F (36.7 C) 98.4 F (36.9 C)  TempSrc: Oral Oral Oral Oral  SpO2: 98% 99% 97% 96%  Weight:      Height:        Intake/Output Summary (Last 24 hours) at 01/28/16 1452 Last data filed at 01/28/16 1400  Gross per 24 hour  Intake              940 ml  Output                0 ml  Net              940 ml   Filed Weights   01/26/16 0900  Weight: 112.5 kg (248 lb)    General: Alert, Awake and Oriented to Time, Place and Person. Appear in mild distress, affect appropriate Eyes: PERRL, Conjunctiva normal ENT: Oral Mucosa clear moist  Neck: no JVD, no Abnormal Mass Or lumps Cardiovascular: S1 and S2 Present, no Murmur, Respiratory: Bilateral Air entry equal and Decreased, no use of accessory muscle, Clear to Auscultation, no Crackles, no wheezes Abdomen: Bowel Sound present, Soft and no tenderness Skin: no redness, no Rash, no induration Extremities: bilateral Pedal edema, no calf tenderness Neurologic: Grossly no focal neuro deficit. Bilaterally Equal motor strength  Data Reviewed: CBC:  Recent Labs Lab 01/25/16 1936 01/27/16 0742 01/28/16 0438  WBC 12.7* 9.6 9.3  NEUTROABS 8.2*  --   --   HGB  16.8 15.5 15.1  HCT 47.4 43.6 43.7  MCV 90.3 90.5 91.0  PLT 218 183 187   Basic Metabolic Panel:  Recent Labs Lab 01/25/16 1936 01/27/16 0742 01/28/16 0438  NA 130* 134* 132*  K 3.5 3.2* 3.1*  CL 92* 100* 95*  CO2 24 25 28   GLUCOSE 401* 250* 308*  BUN 13 9 10   CREATININE 1.01 0.91 0.97  CALCIUM 8.7* 8.7* 8.5*    Liver Function Tests:  Recent Labs Lab 01/25/16 1936  AST 30  ALT 29  ALKPHOS 110  BILITOT 1.0  PROT 6.6  ALBUMIN 4.0   No results for input(s): LIPASE, AMYLASE in the last 168 hours. No results for input(s): AMMONIA in the last 168 hours. Coagulation Profile:  Recent Labs Lab 01/25/16 1936  INR 1.05   Cardiac Enzymes:  Recent Labs Lab 01/26/16 0919  CKTOTAL 90   BNP (last 3 results) No results for input(s): PROBNP in the last 8760 hours.  CBG:  Recent Labs Lab 01/27/16 1219 01/27/16 1659 01/27/16 2218 01/28/16 0730 01/28/16 1138  GLUCAP 263* 267* 344* 299* 341*    Studies: No results found.   Scheduled Meds: . amiodarone  200 mg Oral Daily  . atorvastatin  80 mg Oral Daily  . carvedilol  3.125 mg Oral BID WC  . clonazePAM  1 mg Oral BID  . famotidine  20 mg Oral BID  . furosemide  60 mg Oral BID  . insulin aspart  0-20 Units Subcutaneous TID WC  . insulin aspart  0-5 Units Subcutaneous QHS  . insulin glargine  22 Units Subcutaneous Daily  . methocarbamol  500 mg Oral TID  . omega-3 acid ethyl esters  1 g Oral BID  . potassium chloride SA  40 mEq Oral Daily   Continuous Infusions: . heparin 1,800 Units/hr (01/27/16 2248)   PRN Meds: acetaminophen **OR** acetaminophen, albuterol, amitriptyline, bisacodyl, hydrALAZINE, HYDROcodone-acetaminophen, morphine injection, nitroGLYCERIN, ondansetron **OR** ondansetron (ZOFRAN) IV, polyethylene glycol  Time spent: 30 minutes  Author: Lynden Oxford, MD [facility] Pager: [phone] 01/28/2016 2:52 PM  If 7PM-7AM, please contact night-coverage at [email or web address], password  [facility]

## 2016-01-28 NOTE — Progress Notes (Deleted)
ANTICOAGULATION CONSULT NOTE - Follow Up Consult  Pharmacy Consult:  Heparin Indication: atrial fibrillation  No Known Allergies  Patient Measurements: Heparin Dosing Weight: 93.6 kg   Vital Signs: Temp: 98.1 F (36.7 C) (01/23 0458) Temp Source: Oral (01/23 0458) BP: 97/55 (01/23 0458) Pulse Rate: 57 (01/23 0458)  Labs:  Recent Labs  01/25/16 1936  01/26/16 0919 01/26/16 2028 01/27/16 0742 01/28/16 0438  HGB 16.8  --   --   --  15.5 15.1  HCT 47.4  --   --   --  43.6 43.7  PLT 218  --   --   --  183 187  APTT  --   < > 47* 64* 96* 90*  LABPROT 13.7  --   --   --   --   --   INR 1.05  --   --   --   --   --   HEPARINUNFRC  --   --  0.77*  --  0.71* 0.53  CREATININE 1.01  --   --   --  0.91 0.97  CKTOTAL  --   --  90  --   --   --   < > = values in this interval not displayed.  Estimated Creatinine Clearance: 104.7 mL/min (by C-G formula based on SCr of 0.97 mg/dL).    Assessment: [age] YOM with history of Afib on Eliquis PTA.  Currently on IV heparin bridge in anticipation of angiogram on Wednesday.  APTT and heparin level are both therapeutic and correlating.  No bleeding reported.   Goal of Therapy:  HL 0.3 - 0.7 units/mL Monitor platelets by anticoagulation protocol: Yes    Plan:  - Continue heparin gtt at 1800 units/hr - Daily heparin level, CBC - F/U CBGs, lytes   Jaely Silman D. Laney Potashang, PharmD, BCPS Pager:  [phone] 01/28/2016, 10:13 AM

## 2016-01-29 ENCOUNTER — Encounter (HOSPITAL_COMMUNITY): Payer: Self-pay | Admitting: Vascular Surgery

## 2016-01-29 ENCOUNTER — Encounter (HOSPITAL_COMMUNITY)
Admission: EM | Disposition: A | Discharge status: Home / Self Care | Payer: Self-pay | Source: Home / Self Care | Attending: Internal Medicine

## 2016-01-29 DIAGNOSIS — R52 Pain, unspecified: Secondary | ICD-10-CM

## 2016-01-29 HISTORY — PX: PERIPHERAL VASCULAR CATHETERIZATION: SHX172C

## 2016-01-29 LAB — BASIC METABOLIC PANEL
ANION GAP: 8 (ref 5–15)
BUN: 9 mg/dL (ref 6–20)
CALCIUM: 8.5 mg/dL — AB (ref 8.9–10.3)
CO2: 28 mmol/L (ref 22–32)
CREATININE: 1.06 mg/dL (ref 0.61–1.24)
Chloride: 98 mmol/L — ABNORMAL LOW (ref 101–111)
GFR calc non Af Amer: >60 mL/min (ref >60)
Glucose, Bld: 307 mg/dL — ABNORMAL HIGH (ref 65–99)
Potassium: 3.9 mmol/L (ref 3.5–5.1)
Sodium: 134 mmol/L — ABNORMAL LOW (ref 135–145)

## 2016-01-29 LAB — GLUCOSE, CAPILLARY
GLUCOSE-CAPILLARY: 277 mg/dL — AB (ref 65–99)
GLUCOSE-CAPILLARY: 229 mg/dL — AB (ref 65–99)
GLUCOSE-CAPILLARY: 280 mg/dL — AB (ref 65–99)
Glucose-Capillary: 223 mg/dL — ABNORMAL HIGH (ref 65–99)

## 2016-01-29 LAB — CBC
HCT: 43.2 % (ref 39.0–52.0)
Hemoglobin: 15.3 g/dL (ref 13.0–17.0)
MCH: 32.1 pg (ref 26.0–34.0)
MCHC: 35.4 g/dL (ref 30.0–36.0)
MCV: 90.6 fL (ref 78.0–100.0)
Platelets: 172 10*3/uL (ref 150–400)
RBC: 4.77 MIL/uL (ref 4.22–5.81)
RDW: 12.9 % (ref 11.5–15.5)
WBC: 9.8 10*3/uL (ref 4.0–10.5)

## 2016-01-29 LAB — PROTIME-INR
INR: 1.08
PROTHROMBIN TIME: 14 s (ref 11.4–15.2)

## 2016-01-29 LAB — HEPARIN LEVEL (UNFRACTIONATED): HEPARIN UNFRACTIONATED: 0.27 [IU]/mL — AB (ref 0.30–0.70)

## 2016-01-29 LAB — MRSA PCR SCREENING: MRSA BY PCR: NEGATIVE

## 2016-01-29 SURGERY — ABDOMINAL AORTOGRAM W/LOWER EXTREMITY

## 2016-01-29 MED ORDER — MIDAZOLAM HCL 2 MG/2ML IJ SOLN
INTRAMUSCULAR | Status: DC | PRN
  Administered 2016-01-29: 1 mg via INTRAVENOUS

## 2016-01-29 MED ORDER — HEPARIN (PORCINE) IN NACL 2-0.9 UNIT/ML-% IJ SOLN
INTRAMUSCULAR | Status: DC | PRN
  Administered 2016-01-29: 1000 mL via INTRA_ARTERIAL

## 2016-01-29 MED ORDER — HEPARIN (PORCINE) IN NACL 2-0.9 UNIT/ML-% IJ SOLN
INTRAMUSCULAR | Status: AC
  Filled 2016-01-29: qty 1000

## 2016-01-29 MED ORDER — INSULIN GLARGINE 100 UNIT/ML ~~LOC~~ SOLN
26.0000 [IU] | Freq: Every day | SUBCUTANEOUS | Status: DC
  Administered 2016-01-30: 26 [IU] via SUBCUTANEOUS
  Filled 2016-01-29: qty 0.26

## 2016-01-29 MED ORDER — FENTANYL CITRATE (PF) 100 MCG/2ML IJ SOLN
INTRAMUSCULAR | Status: DC | PRN
  Administered 2016-01-29: 50 ug via INTRAVENOUS

## 2016-01-29 MED ORDER — MORPHINE SULFATE (PF) 4 MG/ML IV SOLN
INTRAVENOUS | Status: AC
  Filled 2016-01-29: qty 1

## 2016-01-29 MED ORDER — MORPHINE SULFATE (PF) 4 MG/ML IV SOLN
2.0000 mg | INTRAVENOUS | Status: DC | PRN
  Administered 2016-01-29: 2 mg via INTRAVENOUS

## 2016-01-29 MED ORDER — IODIXANOL 320 MG/ML IV SOLN
INTRAVENOUS | Status: DC | PRN
  Administered 2016-01-29: 127 mL via INTRA_ARTERIAL

## 2016-01-29 MED ORDER — INSULIN ASPART 100 UNIT/ML ~~LOC~~ SOLN
3.0000 [IU] | Freq: Three times a day (TID) | SUBCUTANEOUS | Status: DC
  Administered 2016-01-29 – 2016-01-30 (×3): 3 [IU] via SUBCUTANEOUS

## 2016-01-29 MED ORDER — HEPARIN (PORCINE) IN NACL 100-0.45 UNIT/ML-% IJ SOLN
2350.0000 [IU]/h | INTRAMUSCULAR | Status: DC
  Administered 2016-01-29: 1800 [IU]/h via INTRAVENOUS
  Administered 2016-01-30: 2150 [IU]/h via INTRAVENOUS
  Administered 2016-01-31: 2350 [IU]/h via INTRAVENOUS
  Filled 2016-01-29 (×4): qty 250

## 2016-01-29 MED ORDER — MIDAZOLAM HCL 2 MG/2ML IJ SOLN
INTRAMUSCULAR | Status: AC
  Filled 2016-01-29: qty 2

## 2016-01-29 MED ORDER — FENTANYL CITRATE (PF) 100 MCG/2ML IJ SOLN
INTRAMUSCULAR | Status: AC
  Filled 2016-01-29: qty 2

## 2016-01-29 MED ORDER — OXYCODONE-ACETAMINOPHEN 5-325 MG PO TABS
1.0000 | ORAL_TABLET | ORAL | Status: DC | PRN
  Administered 2016-01-29 – 2016-01-30 (×4): 2 via ORAL
  Administered 2016-01-30: 1 via ORAL
  Administered 2016-01-30 (×3): 2 via ORAL
  Filled 2016-01-29 (×8): qty 2

## 2016-01-29 MED ORDER — ONDANSETRON HCL 4 MG/2ML IJ SOLN: 4.0000 mg | Freq: Four times a day (QID) | INTRAMUSCULAR | Status: DC | PRN

## 2016-01-29 MED ORDER — LIDOCAINE HCL (PF) 1 % IJ SOLN
INTRAMUSCULAR | Status: DC | PRN
  Administered 2016-01-29: 15 mL via SUBCUTANEOUS

## 2016-01-29 MED ORDER — LIDOCAINE HCL (PF) 1 % IJ SOLN
INTRAMUSCULAR | Status: AC
  Filled 2016-01-29: qty 30

## 2016-01-29 MED ORDER — FAMOTIDINE 20 MG PO TABS
20.0000 mg | ORAL_TABLET | Freq: Once | ORAL | Status: AC
  Administered 2016-01-29: 20 mg via ORAL

## 2016-01-29 MED ORDER — ACETAMINOPHEN 325 MG PO TABS: 650.0000 mg | ORAL_TABLET | ORAL | Status: DC | PRN

## 2016-01-29 MED ORDER — SODIUM CHLORIDE 0.9 % IV SOLN: 1.0000 mL/kg/h | INTRAVENOUS | Status: AC

## 2016-01-29 SURGICAL SUPPLY — 10 items
CATH OMNI FLUSH 5F 65CM (CATHETERS) ×3 IMPLANT
COVER PRB 48X5XTLSCP FOLD TPE (BAG) ×1 IMPLANT
COVER PROBE 5X48 (BAG) ×2
KIT PV (KITS) ×3 IMPLANT
SHEATH PINNACLE 5F 10CM (SHEATH) ×3 IMPLANT
SYR MEDRAD MARK V 150ML (SYRINGE) ×3 IMPLANT
TRANSDUCER W/STOPCOCK (MISCELLANEOUS) ×3 IMPLANT
TRAY PV CATH (CUSTOM PROCEDURE TRAY) ×3 IMPLANT
WIRE BENTSON .035X145CM (WIRE) ×3 IMPLANT
WIRE MINI STICK MAX (SHEATH) ×3 IMPLANT

## 2016-01-29 NOTE — Progress Notes (Addendum)
ANTICOAGULATION CONSULT NOTE - Follow Up Consult  Pharmacy Consult:  Heparin Indication: atrial fibrillation  No Known Allergies  Patient Measurements: Heparin Dosing Weight: 93.6 kg   Vital Signs: Temp: 97.6 F (36.4 C) (01/24 0512) Temp Source: Oral (01/24 0512) BP: 117/60 (01/24 0512) Pulse Rate: 59 (01/24 0512)  Labs:  Recent Labs  01/26/16 0919 01/26/16 2028  01/27/16 0742 01/28/16 0438 01/29/16 0314  HGB  --   --   < > 15.5 15.1 15.3  HCT  --   --   --  43.6 43.7 43.2  PLT  --   --   --  183 187 172  APTT 47* 64*  --  96* 90*  --   LABPROT  --   --   --   --   --  14.0  INR  --   --   --   --   --  1.08  HEPARINUNFRC 0.77*  --   --  0.71* 0.53 0.27*  CREATININE  --   --   --  0.91 0.97 1.06  CKTOTAL 90  --   --   --   --   --   < > = values in this interval not displayed.  Estimated Creatinine Clearance: 95.8 mL/min (by C-G formula based on SCr of 1.06 mg/dL).    Assessment: [age] YOM with history of Afib on Eliquis PTA.  He was transitioned to IV heparin in anticipation of angiogram.  Heparin currently off for angiogram.  Heparin level was drawn prior to heparin being off and it is slightly sub-therapeutic.  No bleeding or issue reported by RN.   Goal of Therapy:  HL 0.3 - 0.7 units/mL Monitor platelets by anticoagulation protocol: Yes    Plan:  - F/U with resuming heparin post procedure   Thuy D. Laney Potashang, PharmD, BCPS Pager:  [phone] 01/29/2016, 7:31 AM      ADDENDUM: Spoke to Dr. [name], ok to resume heparin at 1400 today. Will resume at previously therapeutic rate of 1800 units/hr and follow up daily heparin level.  Louie CasaJennifer Leslea Vowles, PharmD, BCPS 01/29/2016, 1:41 PM

## 2016-01-29 NOTE — Progress Notes (Signed)
Inpatient Diabetes Program Recommendations  AACE/ADA: New Consensus Statement on Inpatient Glycemic Control (2015)  Target Ranges:  Prepandial:   less than 140 mg/dL      Peak postprandial:   less than 180 mg/dL (1-2 hours)      Critically ill patients:  140 - 180 mg/dL   Lab Results  Component Value Date   GLUCAP 223 (H) 01/29/2016   HGBA1C 13.0 (H) 01/26/2016   Results for Loleta RoseAYLOR, Jasmon (MRN [id number]) as of 01/29/2016 13:25  Ref. Range 01/28/2016 11:38 01/28/2016 16:50 01/28/2016 21:10 01/29/2016 07:46 01/29/2016 09:47  Glucose-Capillary Latest Ref Range: 65 - 99 mg/dL 782341 (H) 956355 (H) 213347 (H) 277 (H) 223 (H)   Review of Glycemic Control  Current orders for Inpatient glycemic control:      Resistant correction scale Novolog 0-20 units TIDAC and 0-5 units QHS     Lantus 22 units.  Inpatient Diabetes Program Recommendations:      CBG's up to 355 on current DM insulin regimen.  Lowest CBG in past 24 hours was 223. Total insulin received: 46 units Novolog and 22 units Lantus.      Please consider increasing Lantus to 30 units daily.       Please consider adding Novolog 8 units TIDAC meal coverage if patient eats > 50% of meal (held when NPO).  Thank you,  Kristine LineaKaren Rush Salce, RN, MSN Diabetes Coordinator Inpatient Diabetes Program [phone] (Team Pager)

## 2016-01-29 NOTE — Progress Notes (Signed)
  Progress Note    01/29/2016 8:40 AM Day of Surgery  Subjective:  Stable leg pain today  Vitals:   01/29/16 0512 01/29/16 0747  BP: 117/60 115/67  Pulse: (!) 59 62  Resp: 19   Temp: 97.6 F (36.4 C) 98.1 F (36.7 C)    Physical Exam: aaox3 Non labored respirations Bilateral palpable femoral pulses monophasis dp/pt on left and right  CBC    Component Value Date/Time   WBC 9.8 01/29/2016 0314   RBC 4.77 01/29/2016 0314   HGB 15.3 01/29/2016 0314   HCT 43.2 01/29/2016 0314   PLT 172 01/29/2016 0314   MCV 90.6 01/29/2016 0314   MCH 32.1 01/29/2016 0314   MCHC 35.4 01/29/2016 0314   RDW 12.9 01/29/2016 0314   LYMPHSABS 3.1 01/25/2016 1936   MONOABS 0.9 01/25/2016 1936   EOSABS 0.4 01/25/2016 1936   BASOSABS 0.1 01/25/2016 1936    BMET    Component Value Date/Time   NA 134 (L) 01/29/2016 0314   K 3.9 01/29/2016 0314   CL 98 (L) 01/29/2016 0314   CO2 28 01/29/2016 0314   GLUCOSE 307 (H) 01/29/2016 0314   BUN 9 01/29/2016 0314   CREATININE 1.06 01/29/2016 0314   CALCIUM 8.5 (L) 01/29/2016 0314   GFRNONAA >60 01/29/2016 0314   GFRAA >60 01/29/2016 0314    INR    Component Value Date/Time   INR 1.08 01/29/2016 0314     Intake/Output Summary (Last 24 hours) at 01/29/16 0840 Last data filed at 01/28/16 2200  Gross per 24 hour  Intake             1032 ml  Output                0 ml  Net             1032 ml     Assessment:  56 y.o. male is here with bilateral leg pain  Plan: Angiogram today with bilateral runoff and possible intervention   Janus Vlcek C. [name], MD [facility] [place] Office: [phone] Pager: [phone]  01/29/2016 8:40 AM

## 2016-01-29 NOTE — Progress Notes (Signed)
Patient transported to Cath lab 

## 2016-01-29 NOTE — Op Note (Signed)
    Patient name: Marcus Glass MRN: [id number] DOB: 10/28/1960 Sex: male  01/29/2016 Pre-operative Diagnosis: rest pain left lower extremity Post-operative diagnosis:  Same Surgeon:  Apolinar JunesBrandon C. [name], MD Procedure Performed: 1.  US guided cannulation of right common femoral artery 2.  Aortogram with bilateral lower extremity runoff 3.  Moderated sedation for 25 mins with fentanyl and versed  Indications:  56 yo WM with history of cabg and now bilateral rest pain in his calf muscles that is worse in the left. Consented for aortogram with bilateral lower extremity runoff.   Findings: Aorta and iliac segments looked diffusely diseased without flow-limiting stenoses. 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 The bilateral common femoral arteries are calcified. On the left side there is a very tight stenoses greater than 80% of both the common femoral and profunda femoris arteries. The SFAs bilaterally have multiple less than 50% stenoses and there is three-vessel runoff to both feet without tibial vessel disease.   Procedure:  The patient was identified in the holding area and taken to room 8.  The patient was then placed supine on the table and prepped and draped in the usual sterile fashion.  A time out was called.  Ultrasound was used to evaluate the right common femoral artery.  It was patent .  A digital ultrasound image was acquired.  A micropuncture needle was used to access the right common femoral artery under ultrasound guidance.  An 018 wire was advanced without resistance and a micropuncture sheath was placed.  The 018 wire was removed and a benson wire was placed.  The micropuncture sheath was exchanged for a 5 french sheath.  An omniflush catheter was advanced over the wire to the level of L-1.  An abdominal angiogram was obtained followed by bilateral lower Shiley runoff as well as oblique views of the pelvis on both sides. The findings above we elected to terminate the case catheter and wire  were removed sheath reported in the holding area.  Patient tolerated procedure well without immediate consultation.  Contrast: 127 mL.    [name] [name], MD [facility] [place] Office: [phone] Pager: [phone]

## 2016-01-29 NOTE — Progress Notes (Addendum)
Site area:RFA  Site Prior to Removal:  Level 0 Pressure Applied For:25 min Manual: yes   Patient Status During Pull:  stable Post Pull Site:  Level 0 Post Pull Instructions Given:  yes Post Pull Pulses Present: palpable Dressing Applied:  tegaderm Bedrest begins @ 1025 till 1425 Comments:   

## 2016-01-29 NOTE — Progress Notes (Signed)
01/29/2016  1315 Received pt to room 2W16 from cath lab, s/p angiogram, R groin access site, CDI, no swelling noted.  Instructed pt to remain on bedrest till 1430.  Tele monitor applied and CCMD notified.  Oriented to room, call light and bed.  Call bell in reach, family at bedside. Kathryne HitchAllen, Caera Enwright C

## 2016-01-29 NOTE — Progress Notes (Signed)
Angiogram discussed with Dr [name].  Plan is for left femoral endarterectomy on Friday, 1/26  Marcus Glass Tally Mattox

## 2016-01-29 NOTE — Progress Notes (Signed)
PROGRESS NOTE     Marcus Glass  AVW:[id number] DOB: July 01, 1960 DOA: 01/25/2016 PCP: Paulina Fusi, MD    Brief Narrative:  This is a 56 y.o male who was admitted for significant lower extremity pain on 01/25/2016.  He was found to have peripheral vascular disease with stenosis in both lower extremities. PMH is significant for CAD s/p CABG, peripheral vascular disease, a. Fib, hypertension, type II diabetes mellitus, and hyperlipidemia.  He underwent ultrasound guided cannulation of the right common femoral artery and aortogram with bilateral lower extremity runoff.  Results are significant for an occluded left hypogastric artery, stenoses greater than 80% of the common femoral and profunda femoris arteries on the left side.  SFAs bilaterally have multiple less than 50% stenoses and there is three-vessel runoff to both feet without tibial vessel disease.    Assessment & Plan: Principal Problem: 1. Symptomatic peripheral vascular disease:  Pain has improved with NORCO/VICODIN and femoral artery angiogram was performed. ABI 0.78 right, 0.6 left. The left hypogastric artery is ocluded, the L common femoral and L profunda femoris arteries are stenosed greater than 80%, and the SFAs bilaterally have multiple less than 50% stenoses. Plan is to continue pain medications prn, physical therapy as tolerated and peripheral vascular surgery.      Active Problems: 2. Pressure ulcer on buttock: Present on admission.  Foam dressing is applied.   3. Diabetes mellitus type II (HCC) Patient takes metformin at home.  Metformin stopped in hospital, given 22 units of LANTUS with SSI regimen. CBG today is 223 (9:47am) and was 347 on 01/28/2016.  Continue to monitor.   4.  Chronic systolic CHF (EF 82-95% in 2016) [facility]) Patient appears euvolemic and takes 120mg  of Lasix BID at home. In hospital, dose was changed to 60mg  BID.  Continue lasix.   5. Coronary artery disease s/p CABG 2016. Plan is to continue  monitoring patient w/further cardiac workup prior to vascular surgery.   6. Paroxysmal atrial fibrillation (HCC):  Today patient is in sinus rhythm. CHADS-VASc score is 4.  He is on apixaban at home and is currently on IV heparin. Rate is controlled on amiodarone 200mg . Continue amiodarone and coreg.    7. Hypokalemia has resolved.  Continue to monitor.   DVT prophylaxis: Heparin to be restarted.  Code Status: Full Family Communication: Wife at bedside.  Disposition Plan: Home  Consultants:  Vascular surgery.   Procedures:  Abdominal aortogram with lower extremity.   Antimicrobials: None.   Subjective: Patient reports to be feeling better than when he came to the emergency department, but is still experiencing bilateral lower leg pain that remains a 7/10 without pain medications.  He mentions constipation and feeling bloated, which is likely due to immobilization and pain medications.   Objective: Vitals:   01/29/16 1035 01/29/16 1100 01/29/16 1105 01/29/16 1110  BP: 118/60  116/62   Pulse: (!) 57 (!) 55 (!) 53 (!) 55  Resp: 13 11 11 10   Temp:      TempSrc:      SpO2: 95% 96% 96% 96%  Weight:      Height:        Intake/Output Summary (Last 24 hours) at 01/29/16 1142 Last data filed at 01/28/16 2200  Gross per 24 hour  Intake              552 ml  Output                0 ml  Net  552 ml   Filed Weights   01/26/16 0900  Weight: 112.5 kg (248 lb)    Examination:  General exam: Appears calm and comfortable.  Respiratory system: Clear to auscultation. Respiratory effort normal. Cardiovascular system: S1 & S2 heard, RRR today back in sinus rhythm. No JVD, murmurs, rubs, gallops or clicks. No pedal edema. Gastrointestinal system: Abdomen is mildly distended, soft and nontender. No organomegaly or masses felt. Normal bowel sounds heard. Central nervous system: Alert and oriented. No focal neurological deficits. Extremities: Compression stockings in place  over both lower extremities. Patient is able to move both legs, but cannot ambulate very far due to pain.  Skin: No rashes. Sacral ulcer covered by wound care. Unable to visualize.  Psychiatry: Judgement and insight appear normal. Mood & affect appropriate.     Data Reviewed: I have personally reviewed following labs and imaging studies  CBC:  Recent Labs Lab 01/25/16 1936 01/27/16 0742 01/28/16 0438 01/29/16 0314  WBC 12.7* 9.6 9.3 9.8  NEUTROABS 8.2*  --   --   --   HGB 16.8 15.5 15.1 15.3  HCT 47.4 43.6 43.7 43.2  MCV 90.3 90.5 91.0 90.6  PLT 218 183 187 172   Basic Metabolic Panel:  Recent Labs Lab 01/25/16 1936 01/27/16 0742 01/28/16 0438 01/29/16 0314  NA 130* 134* 132* 134*  K 3.5 3.2* 3.1* 3.9  CL 92* 100* 95* 98*  CO2 24 25 28 28   GLUCOSE 401* 250* 308* 307*  BUN 13 9 10 9   CREATININE 1.01 0.91 0.97 1.06  CALCIUM 8.7* 8.7* 8.5* 8.5*   GFR: Estimated Creatinine Clearance: 95.8 mL/min (by C-G formula based on SCr of 1.06 mg/dL). Liver Function Tests:  Recent Labs Lab 01/25/16 1936  AST 30  ALT 29  ALKPHOS 110  BILITOT 1.0  PROT 6.6  ALBUMIN 4.0   No results for input(s): LIPASE, AMYLASE in the last 168 hours. No results for input(s): AMMONIA in the last 168 hours. Coagulation Profile:  Recent Labs Lab 01/25/16 1936 01/29/16 0314  INR 1.05 1.08   Cardiac Enzymes:  Recent Labs Lab 01/26/16 0919  CKTOTAL 90   BNP (last 3 results) No results for input(s): PROBNP in the last 8760 hours. HbA1C: No results for input(s): HGBA1C in the last 72 hours. CBG:  Recent Labs Lab 01/28/16 1138 01/28/16 1650 01/28/16 2110 01/29/16 0746 01/29/16 0947  GLUCAP 341* 355* 347* 277* 223*   Lipid Profile: No results for input(s): CHOL, HDL, LDLCALC, TRIG, CHOLHDL, LDLDIRECT in the last 72 hours. Thyroid Function Tests: No results for input(s): TSH, T4TOTAL, FREET4, T3FREE, THYROIDAB in the last 72 hours. Anemia Panel: No results for input(s):  VITAMINB12, FOLATE, FERRITIN, TIBC, IRON, RETICCTPCT in the last 72 hours. Sepsis Labs:  Recent Labs Lab 01/25/16 2005 01/25/16 2311  LATICACIDVEN 2.92* 1.94*    Recent Results (from the past 240 hour(s))  MRSA PCR Screening     Status: None   Collection Time: 01/29/16  8:00 AM  Result Value Ref Range Status   MRSA by PCR NEGATIVE NEGATIVE Final    Comment:        The GeneXpert MRSA Assay (FDA approved for NASAL specimens only), is one component of a comprehensive MRSA colonization surveillance program. It is not intended to diagnose MRSA infection nor to guide or monitor treatment for MRSA infections.          Radiology Studies: No results found.      Scheduled Meds: . [MAR Hold] amiodarone  200 mg  Oral Daily  . [MAR Hold] atorvastatin  80 mg Oral Daily  . [MAR Hold] carvedilol  3.125 mg Oral BID WC  . [MAR Hold] clonazePAM  1 mg Oral BID  . [MAR Hold] famotidine  20 mg Oral BID  . [MAR Hold] furosemide  60 mg Oral BID  . [MAR Hold] insulin aspart  0-20 Units Subcutaneous TID WC  . [MAR Hold] insulin aspart  0-5 Units Subcutaneous QHS  . [MAR Hold] insulin glargine  22 Units Subcutaneous Daily  . [MAR Hold] methocarbamol  500 mg Oral TID  . [MAR Hold] omega-3 acid ethyl esters  1 g Oral BID  . [MAR Hold] potassium chloride SA  40 mEq Oral Daily   Continuous Infusions: . sodium chloride 1 mL/kg/hr (01/29/16 1003)     LOS: 3 days    Joanie Coddington, PA-S Florence Canner   Attending MD note  Patient was seen, examined,treatment plan was discussed with the PA-S Joanie Coddington.  I have personally reviewed the clinical findings, lab, imaging studies and management of this patient in detail. I agree with the documentation, as recorded by the PA-S.   Patient is a pleasant 56 yo M with DM2, PAF, CAD, combined CHF who presented on 1/21 with significant bilateral LE pain  Symptomatic peripheral vascular disease - Surgery consulted, appreciate input. Patient  underwent an angiogram today which show significant disease, and plans are for a left femoral endarterectomy in couple of days.  Diabetes mellitus type II (HCC) - still persistently elevated CBGs, increase Lantus, add NovoLog scheduled with meals  Chronic systolic CHF (EF 40-98% in 2016) (HCC) - continue Lasix, he appears euvolemic, we'll obtain a 2-D echo for preop eval. Most recent 2-D echo was done in 2016 and showed ejection fraction of 40-45%  Coronary artery disease s/p CABG 2016 - monitor  Paroxysmal atrial fibrillation (HCC) - currently in sinus, on Eliquis at home and currently on heparin. Continue amiodarone  Hypokalemia has resolved - Continue to monitor.    On Exam: Gen. exam: Awake, alert, not in any distress Chest: Good air entry bilaterally, no rhonchi or rales CVS: S1-S2 regular, no murmurs Abdomen: Soft, nontender and nondistended Neurology: Non-focal Skin: No rash or lesions  Rest as above   If 7PM-7AM, please contact night-coverage [email or web address] Password [facility] 01/29/2016, 11:42 AM

## 2016-01-30 ENCOUNTER — Inpatient Hospital Stay (HOSPITAL_COMMUNITY): Payer: Medicaid Other

## 2016-01-30 LAB — GLUCOSE, CAPILLARY
GLUCOSE-CAPILLARY: 267 mg/dL — AB (ref 65–99)
GLUCOSE-CAPILLARY: 284 mg/dL — AB (ref 65–99)
GLUCOSE-CAPILLARY: 240 mg/dL — AB (ref 65–99)
GLUCOSE-CAPILLARY: 272 mg/dL — AB (ref 65–99)

## 2016-01-30 LAB — CBC
HCT: 43.9 % (ref 39.0–52.0)
Hemoglobin: 15.1 g/dL (ref 13.0–17.0)
MCH: 31.3 pg (ref 26.0–34.0)
MCHC: 34.4 g/dL (ref 30.0–36.0)
MCV: 91.1 fL (ref 78.0–100.0)
PLATELETS: 186 10*3/uL (ref 150–400)
RBC: 4.82 MIL/uL (ref 4.22–5.81)
RDW: 13.4 % (ref 11.5–15.5)
WBC: 12.5 10*3/uL — AB (ref 4.0–10.5)

## 2016-01-30 LAB — BASIC METABOLIC PANEL
ANION GAP: 9 (ref 5–15)
BUN: 5 mg/dL — ABNORMAL LOW (ref 6–20)
CO2: 25 mmol/L (ref 22–32)
CREATININE: 0.88 mg/dL (ref 0.61–1.24)
Calcium: 8.3 mg/dL — ABNORMAL LOW (ref 8.9–10.3)
Chloride: 99 mmol/L — ABNORMAL LOW (ref 101–111)
GFR calc Af Amer: >60 mL/min (ref >60)
Glucose, Bld: 293 mg/dL — ABNORMAL HIGH (ref 65–99)
Potassium: 3.7 mmol/L (ref 3.5–5.1)
Sodium: 133 mmol/L — ABNORMAL LOW (ref 135–145)

## 2016-01-30 LAB — HEPARIN LEVEL (UNFRACTIONATED)
HEPARIN UNFRACTIONATED: 0.18 [IU]/mL — AB (ref 0.30–0.70)
HEPARIN UNFRACTIONATED: 0.27 [IU]/mL — AB (ref 0.30–0.70)
Heparin Unfractionated: 0.28 IU/mL — ABNORMAL LOW (ref 0.30–0.70)

## 2016-01-30 LAB — ECHOCARDIOGRAM COMPLETE
HEIGHTINCHES: 68 in
WEIGHTICAEL: 3996.8 [oz_av]

## 2016-01-30 MED ORDER — LIVING WELL WITH DIABETES BOOK
Freq: Once | Status: AC
  Administered 2016-01-30: 12:00:00
  Filled 2016-01-30: qty 1

## 2016-01-30 MED ORDER — CEFAZOLIN SODIUM-DEXTROSE 2-4 GM/100ML-% IV SOLN
2.0000 g | INTRAVENOUS | Status: DC
  Filled 2016-01-30: qty 100

## 2016-01-30 MED ORDER — MAGNESIUM HYDROXIDE 400 MG/5ML PO SUSP
30.0000 mL | Freq: Once | ORAL | Status: AC
  Administered 2016-01-30: 30 mL via ORAL
  Filled 2016-01-30: qty 30

## 2016-01-30 MED ORDER — INSULIN GLARGINE 100 UNIT/ML ~~LOC~~ SOLN
30.0000 [IU] | Freq: Every day | SUBCUTANEOUS | Status: DC
  Filled 2016-01-30: qty 0.3

## 2016-01-30 MED ORDER — PERFLUTREN LIPID MICROSPHERE
1.0000 mL | INTRAVENOUS | Status: AC | PRN
  Administered 2016-01-30: 2 mL via INTRAVENOUS
  Filled 2016-01-30: qty 10

## 2016-01-30 MED ORDER — INSULIN ASPART 100 UNIT/ML ~~LOC~~ SOLN
4.0000 [IU] | Freq: Three times a day (TID) | SUBCUTANEOUS | Status: DC
  Administered 2016-01-30 – 2016-02-03 (×8): 4 [IU] via SUBCUTANEOUS

## 2016-01-30 MED ORDER — CEFAZOLIN SODIUM-DEXTROSE 2-4 GM/100ML-% IV SOLN
2.0000 g | INTRAVENOUS | Status: AC
  Administered 2016-01-31: 2 g via INTRAVENOUS
  Filled 2016-01-30: qty 100

## 2016-01-30 NOTE — Progress Notes (Signed)
Inpatient Diabetes Program Recommendations  AACE/ADA: New Consensus Statement on Inpatient Glycemic Control (2015)  Target Ranges:  Prepandial:   less than 140 mg/dL      Peak postprandial:   less than 180 mg/dL (1-2 hours)      Critically ill patients:  140 - 180 mg/dL   Lab Results  Component Value Date   GLUCAP 267 (H) 01/30/2016   HGBA1C 13.0 (H) 01/26/2016    Review of Glycemic Control  Inpatient Diabetes Program Recommendations:   Spoke with pt and wife about  A1C results 13.0 and explained what an A1C is, basic pathophysiology of DM Type 2, basic home care, basic diabetes diet nutrition principles, importance of checking CBGs and maintaining good CBG control to prevent long-term and short-term complications. Reviewed signs and symptoms of hyperglycemia and hypoglycemia and how to treat hypoglycemia at home. Also reviewed blood sugar goals at home.  Patient states he knows his CBGs are not controlled but states willingness to followup with his PCP Dr. Tomasa BlaseSchultz in [place]. Patient states he is now on disability so more financially stable.  Thank you, Marcus FischerJudy E. Dynisha Due, RN, MSN, CDE Inpatient Glycemic Control Team Team Pager [phone] (8am-5pm) 01/30/2016 11:45 AM

## 2016-01-30 NOTE — Progress Notes (Signed)
ANTICOAGULATION CONSULT NOTE - Follow Up Consult  Pharmacy Consult:  Heparin Indication: atrial fibrillation  No Known Allergies  Patient Measurements: Heparin Dosing Weight: 93.6 kg   Vital Signs: Temp: 97.6 F (36.4 C) (01/25 0636) Temp Source: Oral (01/25 0636) BP: 122/73 (01/25 0845) Pulse Rate: 66 (01/25 0845)  Labs:  Recent Labs  01/28/16 0438 01/29/16 0314 01/30/16 0344 01/30/16 1128  HGB 15.1 15.3 15.1  --   HCT 43.7 43.2 43.9  --   PLT 187 172 186  --   APTT 90*  --   --   --   LABPROT  --  14.0  --   --   INR  --  1.08  --   --   HEPARINUNFRC 0.53 0.27* 0.18* 0.28*  CREATININE 0.97 1.06 0.88  --     Estimated Creatinine Clearance: 115.9 mL/min (by C-G formula based on SCr of 0.88 mg/dL).   Assessment: [age] YOM with history of Afib on Eliquis PTA.  He was transitioned to IV heparin for procedures. S/p arteriogram 1/24 - heparin restarted post angiogram.  Plans noted for  left femoral endarterectomy on Friday. -heparin level= 0.28 after increase to 2000 units/hr  Goal of Therapy:  HL 0.3 - 0.7 units/mL Monitor platelets by anticoagulation protocol: Yes   Plan:  Increase heparin to 2150 units/hr Will f/u 6 hr heparin level  Harland GermanAndrew Amira Podolak, Pharm D 01/30/2016 12:53 PM

## 2016-01-30 NOTE — Progress Notes (Signed)
  Progress Note    01/30/2016 9:35 AM 1 Day Post-Op  Subjective:  No complaints  Vitals:   01/30/16 0636 01/30/16 0845  BP: 122/70 122/73  Pulse: 62 66  Resp:    Temp: 97.6 F (36.4 C)     Physical Exam: aaox3 Non labored respirations Abdomen is soft Right groin bandage cdi Feet are warm  CBC    Component Value Date/Time   WBC 12.5 (H) 01/30/2016 0344   RBC 4.82 01/30/2016 0344   HGB 15.1 01/30/2016 0344   HCT 43.9 01/30/2016 0344   PLT 186 01/30/2016 0344   MCV 91.1 01/30/2016 0344   MCH 31.3 01/30/2016 0344   MCHC 34.4 01/30/2016 0344   RDW 13.4 01/30/2016 0344   LYMPHSABS 3.1 01/25/2016 1936   MONOABS 0.9 01/25/2016 1936   EOSABS 0.4 01/25/2016 1936   BASOSABS 0.1 01/25/2016 1936    BMET    Component Value Date/Time   NA 133 (L) 01/30/2016 0344   K 3.7 01/30/2016 0344   CL 99 (L) 01/30/2016 0344   CO2 25 01/30/2016 0344   GLUCOSE 293 (H) 01/30/2016 0344   BUN 5 (L) 01/30/2016 0344   CREATININE 0.88 01/30/2016 0344   CALCIUM 8.3 (L) 01/30/2016 0344   GFRNONAA >60 01/30/2016 0344   GFRAA >60 01/30/2016 0344    INR    Component Value Date/Time   INR 1.08 01/29/2016 0314     Intake/Output Summary (Last 24 hours) at 01/30/16 0935 Last data filed at 01/29/16 2035  Gross per 24 hour  Intake              600 ml  Output                0 ml  Net              600 ml     Assessment:  56 y.o. male is here with lle pain, recent cabg. Angiogram with common femoral disease on left.   Plan: Probable OR tomorrow depending on echo today Npo at midnight   Jaymari Cromie C. [name], MD [facility] [place] Office: [phone] Pager: [phone]  01/30/2016 9:35 AM

## 2016-01-30 NOTE — Progress Notes (Signed)
PROGRESS NOTE  Marcus Glass ZOX:[id number] DOB: 08-11-1960 DOA: 01/25/2016 PCP: Paulina Fusi, MD   LOS: 4 days   Brief Narrative: 56 y.o. male with medical history significant for type 2 diabetes mellitus, paroxysmal atrial fibrillation, coronary artery disease s/p CABG, peripheral arterial disease, and chronic combined CHF who presents to the emergency department for evaluation of intractable bilateral lower extremity pain.   Assessment & Plan: Principal Problem:   Intractable pain Active Problems:   Vascular claudication (HCC)   PAD (peripheral artery disease) (HCC)   Bed sore on buttock   Diabetes mellitus type II, non insulin dependent (HCC)   CAD (coronary artery disease)   PAF (paroxysmal atrial fibrillation) (HCC)   Hyponatremia   Chronic diastolic CHF (congestive heart failure) (HCC)   Symptomatic peripheral vascular disease - Surgery consulted, appreciate input. Patient underwent an angiogram 1/24 which showed significant disease, with aortobiiliac segments diffusely diseased without flow-limiting stenosis, however the left hypogastric artery is occluded. tight stenosis on the left side with greater than 80% of both common femoral and profunda femoris arteries. Plan is for a left femoral endarterectomy 1/26.  Diabetes mellitus type II (HCC) - still persistently elevated CBGs, increase Lantus further more today, inclrease NovoLog scheduled with meals  Chronic systolic CHF (EF 81-19% in 2016) (HCC) - continue Lasix, he appears euvolemic, we'll obtain a 2-D echo for preop eval, read pending. Most recent 2-D echo was done in 2016 and showed ejection fraction of 40-45%. No significant chest pain or discomfort at home, has been feeling about the same since his CABG continue Lasix.   Coronary artery disease s/p CABG 2016 - monitor, no chest pain  Paroxysmal atrial fibrillation (HCC) - currently in sinus, on Eliquis at home and currently on heparin. Continue  amiodarone  Hypokalemia has resolved - Continue to monitor.   Hypertension - Continue Coreg, Lasix  Hyperlipidemia  - continue Lipitor   DVT prophylaxis: heparin infusion Code Status: Full code Family Communication: d/w wife bedside Disposition Plan: home when ready   Consultants:   Vascular surgery   Procedures:   2D echo: pending  Antimicrobials:  None    Subjective: - no chest pain, shortness of breath, no abdominal pain, nausea or vomiting.   Objective: Vitals:   01/29/16 1430 01/29/16 2034 01/30/16 0636 01/30/16 0845  BP: 132/74 132/68 122/70 122/73  Pulse: 65 63 62 66  Resp:  18    Temp:  97.3 F (36.3 C) 97.6 F (36.4 C)   TempSrc:  Oral Oral   SpO2:  98% 99% 98%  Weight:   113.3 kg (249 lb 12.8 oz)   Height:        Intake/Output Summary (Last 24 hours) at 01/30/16 1145 Last data filed at 01/29/16 2035  Gross per 24 hour  Intake              600 ml  Output                0 ml  Net              600 ml   Filed Weights   01/26/16 0900 01/30/16 0636  Weight: 112.5 kg (248 lb) 113.3 kg (249 lb 12.8 oz)    Examination: Constitutional: NAD Vitals:   01/29/16 1430 01/29/16 2034 01/30/16 0636 01/30/16 0845  BP: 132/74 132/68 122/70 122/73  Pulse: 65 63 62 66  Resp:  18    Temp:  97.3 F (36.3 C) 97.6 F (36.4 C)   TempSrc:  Oral Oral   SpO2:  98% 99% 98%  Weight:   113.3 kg (249 lb 12.8 oz)   Height:       Eyes: PERRL, lids and conjunctivae normal ENMT: Mucous membranes are moist. No oropharyngeal exudates Neck: normal, supple, no masses, no thyromegaly Respiratory: clear to auscultation bilaterally, no wheezing, no crackles. Cardiovascular: Regular rate and rhythm, no murmurs / rubs / gallops.  Abdomen: no tenderness. Bowel sounds positive. Neurologic: non focal    Data Reviewed: I have personally reviewed following labs and imaging studies  CBC:  Recent Labs Lab 01/25/16 1936 01/27/16 0742 01/28/16 0438 01/29/16 0314  01/30/16 0344  WBC 12.7* 9.6 9.3 9.8 12.5*  NEUTROABS 8.2*  --   --   --   --   HGB 16.8 15.5 15.1 15.3 15.1  HCT 47.4 43.6 43.7 43.2 43.9  MCV 90.3 90.5 91.0 90.6 91.1  PLT 218 183 187 172 186   Basic Metabolic Panel:  Recent Labs Lab 01/25/16 1936 01/27/16 0742 01/28/16 0438 01/29/16 0314 01/30/16 0344  NA 130* 134* 132* 134* 133*  K 3.5 3.2* 3.1* 3.9 3.7  CL 92* 100* 95* 98* 99*  CO2 24 25 28 28 25   GLUCOSE 401* 250* 308* 307* 293*  BUN 13 9 10 9  5*  CREATININE 1.01 0.91 0.97 1.06 0.88  CALCIUM 8.7* 8.7* 8.5* 8.5* 8.3*   GFR: Estimated Creatinine Clearance: 115.9 mL/min (by C-G formula based on SCr of 0.88 mg/dL). Liver Function Tests:  Recent Labs Lab 01/25/16 1936  AST 30  ALT 29  ALKPHOS 110  BILITOT 1.0  PROT 6.6  ALBUMIN 4.0   No results for input(s): LIPASE, AMYLASE in the last 168 hours. No results for input(s): AMMONIA in the last 168 hours. Coagulation Profile:  Recent Labs Lab 01/25/16 1936 01/29/16 0314  INR 1.05 1.08   Cardiac Enzymes:  Recent Labs Lab 01/26/16 0919  CKTOTAL 90   BNP (last 3 results) No results for input(s): PROBNP in the last 8760 hours. HbA1C: No results for input(s): HGBA1C in the last 72 hours. CBG:  Recent Labs Lab 01/29/16 0947 01/29/16 1900 01/29/16 2249 01/30/16 0629 01/30/16 1139  GLUCAP 223* 229* 280* 267* 284*   Lipid Profile: No results for input(s): CHOL, HDL, LDLCALC, TRIG, CHOLHDL, LDLDIRECT in the last 72 hours. Thyroid Function Tests: No results for input(s): TSH, T4TOTAL, FREET4, T3FREE, THYROIDAB in the last 72 hours. Anemia Panel: No results for input(s): VITAMINB12, FOLATE, FERRITIN, TIBC, IRON, RETICCTPCT in the last 72 hours. Urine analysis:    Component Value Date/Time   COLORURINE YELLOW 01/26/2016 0715   APPEARANCEUR CLEAR 01/26/2016 0715   LABSPEC 1.042 (H) 01/26/2016 0715   PHURINE 6.0 01/26/2016 0715   GLUCOSEU >=500 (A) 01/26/2016 0715   HGBUR NEGATIVE 01/26/2016 0715    BILIRUBINUR NEGATIVE 01/26/2016 0715   KETONESUR NEGATIVE 01/26/2016 0715   PROTEINUR NEGATIVE 01/26/2016 0715   NITRITE NEGATIVE 01/26/2016 0715   LEUKOCYTESUR NEGATIVE 01/26/2016 0715   Sepsis Labs: Invalid input(s): PROCALCITONIN, LACTICIDVEN  Recent Results (from the past 240 hour(s))  MRSA PCR Screening     Status: None   Collection Time: 01/29/16  8:00 AM  Result Value Ref Range Status   MRSA by PCR NEGATIVE NEGATIVE Final    Comment:        The GeneXpert MRSA Assay (FDA approved for NASAL specimens only), is one component of a comprehensive MRSA colonization surveillance program. It is not intended to diagnose MRSA infection nor to guide or monitor  treatment for MRSA infections.       Radiology Studies: No results found.   Scheduled Meds: . amiodarone  200 mg Oral Daily  . atorvastatin  80 mg Oral Daily  . carvedilol  3.125 mg Oral BID WC  .  ceFAZolin (ANCEF) IV  2 g Intravenous To SS-Surg  . clonazePAM  1 mg Oral BID  . famotidine  20 mg Oral BID  . furosemide  60 mg Oral BID  . insulin aspart  0-20 Units Subcutaneous TID WC  . insulin aspart  0-5 Units Subcutaneous QHS  . insulin aspart  3 Units Subcutaneous TID WC  . insulin glargine  26 Units Subcutaneous Daily  . living well with diabetes book   Does not apply Once  . methocarbamol  500 mg Oral TID  . omega-3 acid ethyl esters  1 g Oral BID  . potassium chloride SA  40 mEq Oral Daily   Continuous Infusions: . heparin 2,000 Units/hr (01/30/16 0515)     Pamella Pert, MD, PhD [facility] Pager [phone]  If 7PM-7AM, please contact night-coverage [email or web address] Password [email or web address] 01/30/2016, 11:45 AM

## 2016-01-30 NOTE — Progress Notes (Signed)
ANTICOAGULATION CONSULT NOTE - Follow Up Consult  Pharmacy Consult:  Heparin Indication: atrial fibrillation  No Known Allergies  Patient Measurements: Heparin Dosing Weight: 93.6 kg   Vital Signs: Temp: 97.3 F (36.3 C) (01/24 2034) Temp Source: Oral (01/24 2034) BP: 132/68 (01/24 2034) Pulse Rate: 63 (01/24 2034)  Labs:  Recent Labs  01/27/16 0742 01/28/16 0438 01/29/16 0314 01/30/16 0344  HGB 15.5 15.1 15.3 15.1  HCT 43.6 43.7 43.2 43.9  PLT 183 187 172 186  APTT 96* 90*  --   --   LABPROT  --   --  14.0  --   INR  --   --  1.08  --   HEPARINUNFRC 0.71* 0.53 0.27* 0.18*  CREATININE 0.91 0.97 1.06  --     Estimated Creatinine Clearance: 95.8 mL/min (by C-G formula based on SCr of 1.06 mg/dL).   Assessment: [age] YOM with history of Afib on Eliquis PTA.  He was transitioned to IV heparin for procedures. S/p arteriogram 1/24 - heparin restarted post angiogram. Heparin level down to subtherapeutic (0.18) on gtt at 1800 units/hr. CBC stable. No issues with line or bleeding reported per RN.  Goal of Therapy:  HL 0.3 - 0.7 units/mL Monitor platelets by anticoagulation protocol: Yes   Plan:  Increase heparin to 2000 units/hr Will f/u 6 hr heparin level  Christoper Fabianaron Shandra Szymborski, PharmD, BCPS Clinical pharmacist, pager [phone] 01/30/2016, 4:53 AM

## 2016-01-30 NOTE — Progress Notes (Signed)
ANTICOAGULATION CONSULT NOTE - Follow Up Consult  Pharmacy Consult:  Heparin Indication: atrial fibrillation  Allergies  Allergen Reactions  . No Known Allergies     Patient Measurements: Heparin Dosing Weight: 93.6 kg   Vital Signs: Temp: 98.1 F (36.7 C) (01/25 1939) Temp Source: Oral (01/25 1939) BP: 104/60 (01/25 1939) Pulse Rate: 56 (01/25 1939)  Labs:  Recent Labs  01/28/16 0438 01/29/16 0314 01/30/16 0344 01/30/16 1128 01/30/16 2025  HGB 15.1 15.3 15.1  --   --   HCT 43.7 43.2 43.9  --   --   PLT 187 172 186  --   --   APTT 90*  --   --   --   --   LABPROT  --  14.0  --   --   --   INR  --  1.08  --   --   --   HEPARINUNFRC 0.53 0.27* 0.18* 0.28* 0.27*  CREATININE 0.97 1.06 0.88  --   --     Estimated Creatinine Clearance: 115.9 mL/min (by C-G formula based on SCr of 0.88 mg/dL).   Assessment: [name] with history of Afib on Eliquis PTA. He was transitioned to IV heparin for procedures. S/p arteriogram 1/24 - heparin restarted post angiogram. Plans noted forleft femoral endarterectomy on Friday.  Heparin level remains slightly subtherapeutic at 0.27 after increase to 2150 units/hr. CBC stable, no bleed or IV line issues per RN.  Goal of Therapy:  HL 0.3 - 0.7 units/mL Monitor platelets by anticoagulation protocol: Yes   Plan:  Increase heparin to 2350 units/hr 6 hr heparin level Daily heparin level/CBC Monitor s/sx bleeding   Marcus BertinHaley Isbella Glass, PharmD, BCPS Clinical Pharmacist 01/30/2016 9:30 PM

## 2016-01-30 NOTE — Progress Notes (Signed)
  Echocardiogram 2D Echocardiogram with Definity has been performed.  Nolon RodBrown, Marcus Glass 01/30/2016, 11:14 AM

## 2016-01-31 ENCOUNTER — Inpatient Hospital Stay (HOSPITAL_COMMUNITY): Payer: Medicaid Other | Admitting: Certified Registered Nurse Anesthetist

## 2016-01-31 ENCOUNTER — Encounter (HOSPITAL_COMMUNITY)
Admission: EM | Disposition: A | Discharge status: Home / Self Care | Payer: Self-pay | Source: Home / Self Care | Attending: Internal Medicine

## 2016-01-31 ENCOUNTER — Encounter (HOSPITAL_COMMUNITY): Payer: Self-pay | Admitting: *Deleted

## 2016-01-31 DIAGNOSIS — I70222 Atherosclerosis of native arteries of extremities with rest pain, left leg: Secondary | ICD-10-CM

## 2016-01-31 HISTORY — PX: ENDARTERECTOMY FEMORAL: SHX5804

## 2016-01-31 LAB — BASIC METABOLIC PANEL
ANION GAP: 15 (ref 5–15)
BUN: 7 mg/dL (ref 6–20)
CALCIUM: 9.4 mg/dL (ref 8.9–10.3)
CO2: 25 mmol/L (ref 22–32)
Chloride: 91 mmol/L — ABNORMAL LOW (ref 101–111)
Creatinine, Ser: 1.1 mg/dL (ref 0.61–1.24)
GLUCOSE: 331 mg/dL — AB (ref 65–99)
POTASSIUM: 4.7 mmol/L (ref 3.5–5.1)
Sodium: 131 mmol/L — ABNORMAL LOW (ref 135–145)

## 2016-01-31 LAB — HEPARIN LEVEL (UNFRACTIONATED): HEPARIN UNFRACTIONATED: 0.45 [IU]/mL (ref 0.30–0.70)

## 2016-01-31 LAB — CBC
HEMATOCRIT: 48.7 % (ref 39.0–52.0)
Hemoglobin: 17.2 g/dL — ABNORMAL HIGH (ref 13.0–17.0)
MCH: 32 pg (ref 26.0–34.0)
MCHC: 35.3 g/dL (ref 30.0–36.0)
MCV: 90.5 fL (ref 78.0–100.0)
PLATELETS: 217 10*3/uL (ref 150–400)
RBC: 5.38 MIL/uL (ref 4.22–5.81)
RDW: 13.1 % (ref 11.5–15.5)
WBC: 17.1 10*3/uL — ABNORMAL HIGH (ref 4.0–10.5)

## 2016-01-31 LAB — PROTIME-INR
INR: 0.97
Prothrombin Time: 12.9 seconds (ref 11.4–15.2)

## 2016-01-31 LAB — GLUCOSE, CAPILLARY
GLUCOSE-CAPILLARY: 290 mg/dL — AB (ref 65–99)
Glucose-Capillary: 223 mg/dL — ABNORMAL HIGH (ref 65–99)
Glucose-Capillary: 189 mg/dL — ABNORMAL HIGH (ref 65–99)
Glucose-Capillary: 208 mg/dL — ABNORMAL HIGH (ref 65–99)

## 2016-01-31 SURGERY — ENDARTERECTOMY, FEMORAL
Anesthesia: General | Site: Groin | Laterality: Left

## 2016-01-31 MED ORDER — DEXAMETHASONE SODIUM PHOSPHATE 10 MG/ML IJ SOLN
INTRAMUSCULAR | Status: AC
  Filled 2016-01-31: qty 1

## 2016-01-31 MED ORDER — SODIUM CHLORIDE 0.9 % IV SOLN
INTRAVENOUS | Status: DC | PRN
  Administered 2016-01-31: 15:00:00 500 mL

## 2016-01-31 MED ORDER — 0.9 % SODIUM CHLORIDE (POUR BTL) OPTIME
TOPICAL | Status: DC | PRN
  Administered 2016-01-31: 2000 mL

## 2016-01-31 MED ORDER — ONDANSETRON HCL 4 MG/2ML IJ SOLN
INTRAMUSCULAR | Status: DC | PRN
  Administered 2016-01-31: 4 mg via INTRAVENOUS

## 2016-01-31 MED ORDER — SUCCINYLCHOLINE CHLORIDE 200 MG/10ML IV SOSY
PREFILLED_SYRINGE | INTRAVENOUS | Status: AC
  Filled 2016-01-31: qty 10

## 2016-01-31 MED ORDER — PROTAMINE SULFATE 10 MG/ML IV SOLN
INTRAVENOUS | Status: DC | PRN
  Administered 2016-01-31: 20 mg via INTRAVENOUS

## 2016-01-31 MED ORDER — OXYCODONE HCL 5 MG/5ML PO SOLN: 5.0000 mg | Freq: Once | ORAL | Status: DC | PRN

## 2016-01-31 MED ORDER — CEFAZOLIN SODIUM 1 G IJ SOLR
INTRAMUSCULAR | Status: AC
  Filled 2016-01-31: qty 20

## 2016-01-31 MED ORDER — GUAIFENESIN-DM 100-10 MG/5ML PO SYRP: 15.0000 mL | ORAL_SOLUTION | ORAL | Status: DC | PRN

## 2016-01-31 MED ORDER — HYDROMORPHONE HCL 1 MG/ML IJ SOLN
INTRAMUSCULAR | Status: AC
  Filled 2016-01-31: qty 0.5

## 2016-01-31 MED ORDER — SUGAMMADEX SODIUM 500 MG/5ML IV SOLN
INTRAVENOUS | Status: AC
  Filled 2016-01-31: qty 5

## 2016-01-31 MED ORDER — ALUM & MAG HYDROXIDE-SIMETH 200-200-20 MG/5ML PO SUSP: 15.0000 mL | ORAL | Status: DC | PRN

## 2016-01-31 MED ORDER — MAGNESIUM SULFATE 2 GM/50ML IV SOLN
2.0000 g | Freq: Every day | INTRAVENOUS | Status: DC | PRN
  Filled 2016-01-31: qty 50

## 2016-01-31 MED ORDER — METOPROLOL TARTRATE 5 MG/5ML IV SOLN: 2.0000 mg | INTRAVENOUS | Status: DC | PRN

## 2016-01-31 MED ORDER — SODIUM CHLORIDE 0.9 % IV SOLN
INTRAVENOUS | Status: DC
  Administered 2016-01-31: 125 mL/h via INTRAVENOUS
  Administered 2016-02-01: 1000 mL via INTRAVENOUS
  Administered 2016-02-01: 22:00:00 via INTRAVENOUS

## 2016-01-31 MED ORDER — ONDANSETRON HCL 4 MG/2ML IJ SOLN
INTRAMUSCULAR | Status: AC
  Filled 2016-01-31: qty 2

## 2016-01-31 MED ORDER — PANTOPRAZOLE SODIUM 40 MG PO TBEC
40.0000 mg | DELAYED_RELEASE_TABLET | Freq: Every day | ORAL | Status: DC
  Administered 2016-01-31 – 2016-02-02 (×2): 40 mg via ORAL
  Filled 2016-01-31 (×4): qty 1

## 2016-01-31 MED ORDER — PHENYLEPHRINE HCL 10 MG/ML IJ SOLN
INTRAMUSCULAR | Status: DC | PRN
  Administered 2016-01-31: 80 ug via INTRAVENOUS

## 2016-01-31 MED ORDER — ROCURONIUM BROMIDE 100 MG/10ML IV SOLN
INTRAVENOUS | Status: DC | PRN
Start: 2016-01-31 — End: 2016-01-31
  Administered 2016-01-31: 50 mg via INTRAVENOUS

## 2016-01-31 MED ORDER — HEPARIN SODIUM (PORCINE) 1000 UNIT/ML IJ SOLN
INTRAMUSCULAR | Status: DC | PRN
  Administered 2016-01-31: 10 mL via INTRAVENOUS

## 2016-01-31 MED ORDER — EPHEDRINE 5 MG/ML INJ
INTRAVENOUS | Status: AC
  Filled 2016-01-31: qty 20

## 2016-01-31 MED ORDER — PROMETHAZINE HCL 25 MG/ML IJ SOLN
12.5000 mg | Freq: Once | INTRAMUSCULAR | Status: AC
  Administered 2016-01-31: 12.5 mg via INTRAVENOUS
  Filled 2016-01-31: qty 1

## 2016-01-31 MED ORDER — HEPARIN SODIUM (PORCINE) 1000 UNIT/ML IJ SOLN
INTRAMUSCULAR | Status: AC
  Filled 2016-01-31: qty 2

## 2016-01-31 MED ORDER — ACETAMINOPHEN 650 MG RE SUPP: 325.0000 mg | RECTAL | Status: DC | PRN

## 2016-01-31 MED ORDER — SODIUM CHLORIDE 0.9 % IV SOLN: 500.0000 mL | Freq: Once | INTRAVENOUS | Status: DC | PRN

## 2016-01-31 MED ORDER — ROCURONIUM BROMIDE 50 MG/5ML IV SOSY
PREFILLED_SYRINGE | INTRAVENOUS | Status: AC
  Filled 2016-01-31: qty 5

## 2016-01-31 MED ORDER — LIDOCAINE HCL (CARDIAC) 20 MG/ML IV SOLN
INTRAVENOUS | Status: DC | PRN
  Administered 2016-01-31: 60 mg via INTRAVENOUS

## 2016-01-31 MED ORDER — ACETAMINOPHEN 325 MG PO TABS: 325.0000 mg | ORAL_TABLET | ORAL | Status: DC | PRN

## 2016-01-31 MED ORDER — DEXTROSE 5 % IV SOLN
1.5000 g | Freq: Two times a day (BID) | INTRAVENOUS | Status: AC
  Administered 2016-01-31 – 2016-02-01 (×2): 1.5 g via INTRAVENOUS
  Filled 2016-01-31 (×3): qty 1.5

## 2016-01-31 MED ORDER — DOCUSATE SODIUM 100 MG PO CAPS
100.0000 mg | ORAL_CAPSULE | Freq: Every day | ORAL | Status: DC
  Administered 2016-02-01 – 2016-02-03 (×3): 100 mg via ORAL
  Filled 2016-01-31 (×3): qty 1

## 2016-01-31 MED ORDER — LACTATED RINGERS IV SOLN
INTRAVENOUS | Status: DC
  Administered 2016-01-31 (×3): via INTRAVENOUS

## 2016-01-31 MED ORDER — PHENOL 1.4 % MT LIQD
1.0000 | OROMUCOSAL | Status: DC | PRN
Start: 2016-01-31 — End: 2016-02-03

## 2016-01-31 MED ORDER — OXYCODONE HCL 5 MG PO TABS: 5.0000 mg | ORAL_TABLET | Freq: Once | ORAL | Status: DC | PRN

## 2016-01-31 MED ORDER — INSULIN GLARGINE 100 UNIT/ML ~~LOC~~ SOLN
33.0000 [IU] | Freq: Every day | SUBCUTANEOUS | Status: DC
  Administered 2016-02-01 – 2016-02-03 (×3): 33 [IU] via SUBCUTANEOUS
  Filled 2016-01-31 (×4): qty 0.33

## 2016-01-31 MED ORDER — PROPOFOL 10 MG/ML IV BOLUS
INTRAVENOUS | Status: AC
  Filled 2016-01-31: qty 20

## 2016-01-31 MED ORDER — ONDANSETRON HCL 4 MG/2ML IJ SOLN: 4.0000 mg | Freq: Four times a day (QID) | INTRAMUSCULAR | Status: DC | PRN

## 2016-01-31 MED ORDER — FENTANYL CITRATE (PF) 100 MCG/2ML IJ SOLN
INTRAMUSCULAR | Status: AC
  Filled 2016-01-31: qty 4

## 2016-01-31 MED ORDER — EPHEDRINE SULFATE 50 MG/ML IJ SOLN
INTRAMUSCULAR | Status: DC | PRN
Start: 2016-01-31 — End: 2016-01-31
  Administered 2016-01-31: 10 mg via INTRAVENOUS

## 2016-01-31 MED ORDER — MIDAZOLAM HCL 2 MG/2ML IJ SOLN
INTRAMUSCULAR | Status: AC
  Filled 2016-01-31: qty 2

## 2016-01-31 MED ORDER — LABETALOL HCL 5 MG/ML IV SOLN: 10.0000 mg | INTRAVENOUS | Status: DC | PRN

## 2016-01-31 MED ORDER — SUGAMMADEX SODIUM 200 MG/2ML IV SOLN
INTRAVENOUS | Status: DC | PRN
  Administered 2016-01-31: 300 mg via INTRAVENOUS

## 2016-01-31 MED ORDER — HEPARIN (PORCINE) IN NACL 100-0.45 UNIT/ML-% IJ SOLN
2500.0000 [IU]/h | INTRAMUSCULAR | Status: DC
  Administered 2016-01-31 – 2016-02-01 (×2): 2350 [IU]/h via INTRAVENOUS
  Filled 2016-01-31 (×3): qty 250

## 2016-01-31 MED ORDER — HYDROMORPHONE HCL 1 MG/ML IJ SOLN
0.2500 mg | INTRAMUSCULAR | Status: DC | PRN
  Administered 2016-01-31 (×3): 0.5 mg via INTRAVENOUS

## 2016-01-31 MED ORDER — PROTAMINE SULFATE 10 MG/ML IV SOLN
INTRAVENOUS | Status: AC
  Filled 2016-01-31: qty 10

## 2016-01-31 MED ORDER — MIDAZOLAM HCL 5 MG/5ML IJ SOLN
INTRAMUSCULAR | Status: DC | PRN
Start: 2016-01-31 — End: 2016-01-31
  Administered 2016-01-31: 1 mg via INTRAVENOUS

## 2016-01-31 MED ORDER — POTASSIUM CHLORIDE CRYS ER 20 MEQ PO TBCR: 20.0000 meq | EXTENDED_RELEASE_TABLET | Freq: Every day | ORAL | Status: DC | PRN

## 2016-01-31 MED ORDER — PHENYLEPHRINE HCL 10 MG/ML IJ SOLN
INTRAVENOUS | Status: DC | PRN
  Administered 2016-01-31: 20 ug/min via INTRAVENOUS

## 2016-01-31 MED ORDER — HEMOSTATIC AGENTS (NO CHARGE) OPTIME
TOPICAL | Status: DC | PRN
  Administered 2016-01-31: 1 via TOPICAL

## 2016-01-31 MED ORDER — PHENYLEPHRINE 40 MCG/ML (10ML) SYRINGE FOR IV PUSH (FOR BLOOD PRESSURE SUPPORT)
PREFILLED_SYRINGE | INTRAVENOUS | Status: AC
  Filled 2016-01-31: qty 10

## 2016-01-31 MED ORDER — PROPOFOL 10 MG/ML IV BOLUS
INTRAVENOUS | Status: DC | PRN
Start: 2016-01-31 — End: 2016-01-31
  Administered 2016-01-31: 200 mg via INTRAVENOUS

## 2016-01-31 MED ORDER — FENTANYL CITRATE (PF) 100 MCG/2ML IJ SOLN
INTRAMUSCULAR | Status: DC | PRN
  Administered 2016-01-31 (×4): 50 ug via INTRAVENOUS

## 2016-01-31 SURGICAL SUPPLY — 44 items
BANDAGE ACE 4X5 VEL STRL LF (GAUZE/BANDAGES/DRESSINGS) IMPLANT
CANISTER SUCTION 2500CC (MISCELLANEOUS) ×3 IMPLANT
CLIP TI MEDIUM 24 (CLIP) ×3 IMPLANT
CLIP TI WIDE RED SMALL 24 (CLIP) ×3 IMPLANT
DERMABOND ADVANCED (GAUZE/BANDAGES/DRESSINGS) ×2
DERMABOND ADVANCED .7 DNX12 (GAUZE/BANDAGES/DRESSINGS) ×1 IMPLANT
DRAIN CHANNEL 15F RND FF W/TCR (WOUND CARE) IMPLANT
DRAPE X-RAY CASS 24X20 (DRAPES) IMPLANT
DRSG COVADERM 4X10 (GAUZE/BANDAGES/DRESSINGS) IMPLANT
DRSG COVADERM 4X8 (GAUZE/BANDAGES/DRESSINGS) IMPLANT
ELECT REM PT RETURN 9FT ADLT (ELECTROSURGICAL) ×3
ELECTRODE REM PT RTRN 9FT ADLT (ELECTROSURGICAL) ×1 IMPLANT
EVACUATOR SILICONE 100CC (DRAIN) IMPLANT
GLOVE BIO SURGEON STRL SZ7.5 (GLOVE) ×3 IMPLANT
GLOVE BIOGEL M 6.5 STRL (GLOVE) ×3 IMPLANT
GLOVE BIOGEL M 7.0 STRL (GLOVE) ×3 IMPLANT
GLOVE BIOGEL PI IND STRL 7.0 (GLOVE) ×3 IMPLANT
GLOVE BIOGEL PI INDICATOR 7.0 (GLOVE) ×6
GOWN STRL REUS W/ TWL LRG LVL3 (GOWN DISPOSABLE) ×2 IMPLANT
GOWN STRL REUS W/ TWL XL LVL3 (GOWN DISPOSABLE) ×1 IMPLANT
GOWN STRL REUS W/TWL LRG LVL3 (GOWN DISPOSABLE) ×4
GOWN STRL REUS W/TWL XL LVL3 (GOWN DISPOSABLE) ×2
HEMOSTAT SNOW SURGICEL 2X4 (HEMOSTASIS) ×3 IMPLANT
KIT BASIN OR (CUSTOM PROCEDURE TRAY) ×3 IMPLANT
KIT ROOM TURNOVER OR (KITS) ×3 IMPLANT
NS IRRIG 1000ML POUR BTL (IV SOLUTION) ×6 IMPLANT
PACK PERIPHERAL VASCULAR (CUSTOM PROCEDURE TRAY) ×3 IMPLANT
PAD ARMBOARD 7.5X6 YLW CONV (MISCELLANEOUS) ×6 IMPLANT
SET COLLECT BLD 21X3/4 12 (NEEDLE) IMPLANT
SPONGE INTESTINAL PEANUT (DISPOSABLE) ×3 IMPLANT
STOPCOCK 4 WAY LG BORE MALE ST (IV SETS) IMPLANT
STRIP PERIGUARD 6X8 (Vascular Products) ×3 IMPLANT
SUT ETHILON 3 0 PS 1 (SUTURE) IMPLANT
SUT MNCRL AB 4-0 PS2 18 (SUTURE) ×3 IMPLANT
SUT PROLENE 5 0 C 1 24 (SUTURE) ×3 IMPLANT
SUT PROLENE 5 0 C 1 36 (SUTURE) ×3 IMPLANT
SUT PROLENE 6 0 BV (SUTURE) ×6 IMPLANT
SUT VIC AB 2-0 CT1 27 (SUTURE) ×2
SUT VIC AB 2-0 CT1 TAPERPNT 27 (SUTURE) ×1 IMPLANT
SUT VIC AB 3-0 SH 27 (SUTURE) ×2
SUT VIC AB 3-0 SH 27X BRD (SUTURE) ×1 IMPLANT
TUBING EXTENTION W/L.L. (IV SETS) IMPLANT
UNDERPAD 30X30 (UNDERPADS AND DIAPERS) ×3 IMPLANT
WATER STERILE IRR 1000ML POUR (IV SOLUTION) ×3 IMPLANT

## 2016-01-31 NOTE — Progress Notes (Signed)
ANTICOAGULATION CONSULT NOTE - Follow Up Consult  Pharmacy Consult:  Heparin Indication: atrial fibrillation  Allergies  Allergen Reactions  . No Known Allergies     Patient Measurements: Heparin Dosing Weight: 93.6 kg   Vital Signs: Temp: 99.3 F (37.4 C) (01/26 1645) BP: 119/67 (01/26 1645) Pulse Rate: 78 (01/26 1645)  Labs:  Recent Labs  01/29/16 0314 01/30/16 0344 01/30/16 1128 01/30/16 2025 01/31/16 0338  HGB 15.3 15.1  --   --  17.2*  HCT 43.2 43.9  --   --  48.7  PLT 172 186  --   --  217  LABPROT 14.0  --   --   --  12.9  INR 1.08  --   --   --  0.97  HEPARINUNFRC 0.27* 0.18* 0.28* 0.27* 0.45  CREATININE 1.06 0.88  --   --  1.10    Estimated Creatinine Clearance: 92.7 mL/min (by C-G formula based on SCr of 1.1 mg/dL).   Assessment: 56 y/o M here with lower leg pain, on apixaban PTA for afib - holding and started heparin in anticipation of vascular procedure. Dopplers negative for DVT. S/p angiogram on 1/24, now s/p OR today for L femoral endarterectomy with patch angioplasty. Pharmacy consulted to resume heparin at 2200 tonight per Vascular. CBC wnl. Previously therapeutic on heparin at 2350 units/h.   Goal of Therapy:  HL 0.3 - 0.7 units/mL Monitor platelets by anticoagulation protocol: Yes   Plan:  -Resume heparin at 2350 units/hr at 2200 tonight (no bolus) -6h heparin level -Daily heparin level, CBC -Monitor for s/sx bleeding   Babs BertinHaley Kevonna Nolte, PharmD, BCPS Clinical Pharmacist 01/31/2016 5:10 PM

## 2016-01-31 NOTE — Progress Notes (Signed)
  Progress Note    01/31/2016 8:44 AM 2 Days Post-Op  Subjective:  No new issues  Vitals:   01/30/16 1939 01/31/16 0414  BP: 104/60 (!) 139/51  Pulse: (!) 56 61  Resp: 18 18  Temp: 98.1 F (36.7 C) 97.4 F (36.3 C)    Physical Exam: aaox3 Non labored respirations Abdomen is soft Minimal edema of ble  CBC    Component Value Date/Time   WBC 17.1 (H) 01/31/2016 0338   RBC 5.38 01/31/2016 0338   HGB 17.2 (H) 01/31/2016 0338   HCT 48.7 01/31/2016 0338   PLT 217 01/31/2016 0338   MCV 90.5 01/31/2016 0338   MCH 32.0 01/31/2016 0338   MCHC 35.3 01/31/2016 0338   RDW 13.1 01/31/2016 0338   LYMPHSABS 3.1 01/25/2016 1936   MONOABS 0.9 01/25/2016 1936   EOSABS 0.4 01/25/2016 1936   BASOSABS 0.1 01/25/2016 1936    BMET    Component Value Date/Time   NA 131 (L) 01/31/2016 0338   K 4.7 01/31/2016 0338   CL 91 (L) 01/31/2016 0338   CO2 25 01/31/2016 0338   GLUCOSE 331 (H) 01/31/2016 0338   BUN 7 01/31/2016 0338   CREATININE 1.10 01/31/2016 0338   CALCIUM 9.4 01/31/2016 0338   GFRNONAA >60 01/31/2016 0338   GFRAA >60 01/31/2016 0338    INR    Component Value Date/Time   INR 0.97 01/31/2016 0338     Intake/Output Summary (Last 24 hours) at 01/31/16 0844 Last data filed at 01/31/16 0300  Gross per 24 hour  Intake           710.42 ml  Output                0 ml  Net           710.42 ml     Assessment:  56 y.o. male is here with LLE pain and recent angiogram demonstrating stenosis of left common femoral artery.  Plan: OR today for left femoral endarterectomy with patch angioplasty. Discussed risks and benefits with patient and wife yesterday including cardiovascular, injury to blood vessel, bleeding, not improving pain and wound complications. They demonstrate good understanding. Proceed today.   Rajveer Handler C. [name], MD [facility] Office: [phone] Pager: [phone]  01/31/2016 8:44 AM

## 2016-01-31 NOTE — Progress Notes (Signed)
PROGRESS NOTE  Marcus Glass ONG:[id number] DOB: [birth year] DOA: 01/25/2016 PCP: Paulina Fusi, MD   LOS: 5 days   Brief Narrative: This is a 56 y.o. Male who was admitted for significant lower extremity pain on 01/25/2016.  Patient was found to have peripheral vascular disease with stenosis in both lower extremities.  PMH significant for CAD s/p CABG 2016, peripheral vascular disease, a. Fib, hypertension, type II diabetes mellitus and hyperlipidemia.  Today he is scheduled for left femoral endarterectomy with Dr. Randie Heinz.   Assessment & Plan: Principal Problem:   Intractable pain Active Problems:   Vascular claudication (HCC)   PAD (peripheral artery disease) (HCC)   Bed sore on buttock   Diabetes mellitus type II, non insulin dependent (HCC)   CAD (coronary artery disease)   PAF (paroxysmal atrial fibrillation) (HCC)   Hyponatremia   Chronic diastolic CHF (congestive heart failure) (HCC)  Symptomatic peripheral vascular disease - Left femoral endarterectomy scheduled for this afternoon with Dr. Randie Heinz.   Diabetes mellitus type II (HCC)  - Elevated CBGs. Continue Lantus and novolog with meals. Continue to monitor CBGs.   Chronic systolic CHF (EF 44-01% as of 0/27/2536)  - continue lasix, he appears euvolemic.  - Pre-op 2-D echo performed yesterday (01/31/2016) and revealed minimal changes to echo in 2016.   Coronary artery disease s/p CABG 2016  - monitor, no chest pain.   Mild leukocytosis  - 12.5 >> 17 - Unclear etiology, he is afebrile, chest x-ray done on admission was negative.  Hypertension - Continue Coreg, Lasix   Hyperlipidemia - Continue lipitor.   DVT prophylaxis: Heparin infusion. Code Status: Full code Family Communication: Wife at bedside.  Disposition Plan: Home when ready    Consultants:   Vascular Surgery  Procedures:   Left femoral endarterectomy scheduled for 01/31/2016.  Antimicrobials:  None.   Subjective: Patient reports an  improvement in bilateral leg pain today. He mentions vomiting twice throughout the evening, but states it was non bloody and non-bilious. He is usually nauseous with post nasal drainage, which he notices today.  He reports feeling much better this morning after taking phenergan.   Objective: Vitals:   01/30/16 0845 01/30/16 1351 01/30/16 1939 01/31/16 0414  BP: 122/73 134/82 104/60 (!) 139/51  Pulse: 66 (!) 56 (!) 56 61  Resp:  18 18 18   Temp:  97.8 F (36.6 C) 98.1 F (36.7 C) 97.4 F (36.3 C)  TempSrc:  Oral Oral Oral  SpO2: 98% 97% 97% 95%  Weight:      Height:        Intake/Output Summary (Last 24 hours) at 01/31/16 0901 Last data filed at 01/31/16 0300  Gross per 24 hour  Intake           710.42 ml  Output                0 ml  Net           710.42 ml   Filed Weights   01/26/16 0900 01/30/16 0636  Weight: 112.5 kg (248 lb) 113.3 kg (249 lb 12.8 oz)    Examination: Constitutional: NAD Vitals:   01/30/16 0845 01/30/16 1351 01/30/16 1939 01/31/16 0414  BP: 122/73 134/82 104/60 (!) 139/51  Pulse: 66 (!) 56 (!) 56 61  Resp:  18 18 18   Temp:  97.8 F (36.6 C) 98.1 F (36.7 C) 97.4 F (36.3 C)  TempSrc:  Oral Oral Oral  SpO2: 98% 97% 97% 95%  Weight:  Height:       Eyes:  lids and conjunctivae normal ENMT: Mucous membranes are moist. No oropharyngeal exudates, no tonsilar erythema.  Head: Denies pain to palpation of the frontal and maxillary sinuses.  Respiratory: clear to auscultation bilaterally, no wheezing, no crackles. Normal respiratory effort. No accessory muscle use.  Cardiovascular: Regular rate and rhythm, no murmurs / rubs / gallops. No LE edema. 1+ pedal pulses.  Abdomen: no tenderness. Bowel sounds positive.  Musculoskeletal:  No joint deformity upper and lower extremities. No contractures. Normal muscle tone.  Skin: small sacral pressure ulcer.  Neurologic: No focal deficits.  Psychiatric: Normal judgment and insight. Alert and oriented x 3.  Normal mood.    Data Reviewed: I have personally reviewed following labs and imaging studies  CBC:  Recent Labs Lab 01/25/16 1936 01/27/16 0742 01/28/16 0438 01/29/16 0314 01/30/16 0344 01/31/16 0338  WBC 12.7* 9.6 9.3 9.8 12.5* 17.1*  NEUTROABS 8.2*  --   --   --   --   --   HGB 16.8 15.5 15.1 15.3 15.1 17.2*  HCT 47.4 43.6 43.7 43.2 43.9 48.7  MCV 90.3 90.5 91.0 90.6 91.1 90.5  PLT 218 183 187 172 186 217   Basic Metabolic Panel:  Recent Labs Lab 01/27/16 0742 01/28/16 0438 01/29/16 0314 01/30/16 0344 01/31/16 0338  NA 134* 132* 134* 133* 131*  K 3.2* 3.1* 3.9 3.7 4.7  CL 100* 95* 98* 99* 91*  CO2 25 28 28 25 25   GLUCOSE 250* 308* 307* 293* 331*  BUN 9 10 9  5* 7  CREATININE 0.91 0.97 1.06 0.88 1.10  CALCIUM 8.7* 8.5* 8.5* 8.3* 9.4   GFR: Estimated Creatinine Clearance: 92.7 mL/min (by C-G formula based on SCr of 1.1 mg/dL). Liver Function Tests:  Recent Labs Lab 01/25/16 1936  AST 30  ALT 29  ALKPHOS 110  BILITOT 1.0  PROT 6.6  ALBUMIN 4.0   No results for input(s): LIPASE, AMYLASE in the last 168 hours. No results for input(s): AMMONIA in the last 168 hours. Coagulation Profile:  Recent Labs Lab 01/25/16 1936 01/29/16 0314 01/31/16 0338  INR 1.05 1.08 0.97   Cardiac Enzymes:  Recent Labs Lab 01/26/16 0919  CKTOTAL 90   BNP (last 3 results) No results for input(s): PROBNP in the last 8760 hours. HbA1C: No results for input(s): HGBA1C in the last 72 hours. CBG:  Recent Labs Lab 01/30/16 0629 01/30/16 1139 01/30/16 1648 01/30/16 2057 01/31/16 0628  GLUCAP 267* 284* 240* 272* 290*   Lipid Profile: No results for input(s): CHOL, HDL, LDLCALC, TRIG, CHOLHDL, LDLDIRECT in the last 72 hours. Thyroid Function Tests: No results for input(s): TSH, T4TOTAL, FREET4, T3FREE, THYROIDAB in the last 72 hours. Anemia Panel: No results for input(s): VITAMINB12, FOLATE, FERRITIN, TIBC, IRON, RETICCTPCT in the last 72 hours. Urine analysis:     Component Value Date/Time   COLORURINE YELLOW 01/26/2016 0715   APPEARANCEUR CLEAR 01/26/2016 0715   LABSPEC 1.042 (H) 01/26/2016 0715   PHURINE 6.0 01/26/2016 0715   GLUCOSEU >=500 (A) 01/26/2016 0715   HGBUR NEGATIVE 01/26/2016 0715   BILIRUBINUR NEGATIVE 01/26/2016 0715   KETONESUR NEGATIVE 01/26/2016 0715   PROTEINUR NEGATIVE 01/26/2016 0715   NITRITE NEGATIVE 01/26/2016 0715   LEUKOCYTESUR NEGATIVE 01/26/2016 0715   Sepsis Labs: Invalid input(s): PROCALCITONIN, LACTICIDVEN  Recent Results (from the past 240 hour(s))  MRSA PCR Screening     Status: None   Collection Time: 01/29/16  8:00 AM  Result Value Ref Range Status  MRSA by PCR NEGATIVE NEGATIVE Final    Comment:        The GeneXpert MRSA Assay (FDA approved for NASAL specimens only), is one component of a comprehensive MRSA colonization surveillance program. It is not intended to diagnose MRSA infection nor to guide or monitor treatment for MRSA infections.       Radiology Studies: No results found.   Scheduled Meds: . amiodarone  200 mg Oral Daily  . atorvastatin  80 mg Oral Daily  . carvedilol  3.125 mg Oral BID WC  .  ceFAZolin (ANCEF) IV  2 g Intravenous To SS-Surg  . clonazePAM  1 mg Oral BID  . famotidine  20 mg Oral BID  . furosemide  60 mg Oral BID  . insulin aspart  0-20 Units Subcutaneous TID WC  . insulin aspart  0-5 Units Subcutaneous QHS  . insulin aspart  4 Units Subcutaneous TID WC  . insulin glargine  30 Units Subcutaneous Daily  . methocarbamol  500 mg Oral TID  . omega-3 acid ethyl esters  1 g Oral BID  . potassium chloride SA  40 mEq Oral Daily   Continuous Infusions: . heparin 2,350 Units/hr (01/31/16 0847)   Joanie Coddington, PA-S [facility]anner   Attending MD note  Patient was seen, examined,treatment plan was discussed with the PA-S Joanie Coddington.  I have personally reviewed the clinical findings, lab, imaging studies and management of this patient in detail. I agree  with the documentation, as recorded by the PA-S.   BP (!) 139/51 (BP Location: Right Arm)   Pulse 61   Temp 97.4 F (36.3 C) (Oral)   Resp 18   Ht 5\' 8"  (1.727 m)   Wt 113.3 kg (249 lb 12.8 oz)   SpO2 95%   BMI 37.98 kg/m  On Exam: Gen. exam: Awake, alert, not in any distress Chest: Good air entry bilaterally, no rhonchi or rales CVS: S1-S2 regular, no murmurs Abdomen: Soft, nontender and nondistended Neurology: Non-focal Skin: No rash or lesions  Symptomatic peripheral vascular disease- Surgery consulted, appreciate input. Patient underwent an angiogram 1/24 which showed significant disease, with aortobiiliac segments diffusely diseased without flow-limiting stenosis, however the left hypogastric artery is occluded. tight stenosis on the left side with greater than 80% of both common femoral and profunda femoris arteries. Plan is for a left femoral endarterectomy today  Diabetes mellitus type II (HCC)- still persistently elevated CBGs, increase Lantus further more today, inclrease NovoLog scheduled with meals  Chronic systolicCHF (EF 40-45% in 2016)(HCC) - continue Lasix, he appears euvolemic. Most recent 2-D echo was done in 2016 and showed ejection fraction of 40-45%. No significant chest pain or discomfort at home, has been feeling about the same since his CABG continue Lasix. Repeat 2-D echo on 1/25 with EF of 40-45%, no significant changes as to the one in the past. Moderate perioperative risk, discuss this with the patient and patient's wife  Coronary artery disease s/p CABG 2016- monitor, no chest pain  Paroxysmal atrial fibrillation (HCC) - currently in sinus, on Eliquis at home and currently on heparin. Continue amiodarone  Hypokalemia has resolved- Continue to monitor.   Hypertension - Continue Coreg, Lasix  Hyperlipidemia  - continue Lipitor  Rest as above   Pamella Pert, MD, PhD [facility] Pager [phone]  If 7PM-7AM, please contact  night-coverage [email or web address] Password [email or web address] 01/31/2016, 9:01 AM

## 2016-01-31 NOTE — Op Note (Signed)
    Patient name: Marcus RoseWilliam Glass MRN: [id number] DOB: 09/15/1960 Sex: male  01/31/2016 Pre-operative Diagnosis: left leg rest pain Post-operative diagnosis:  Same Surgeon:  Apolinar JunesBrandon C. [name], MD Assistant: Lianne CureMaureen Collins, PA Procedure Performed: left common femoral endarterectomy with bovine pericardial patch angioplasty  Indications:  56 year old male with history of left lower extremity pain. He had antrum demonstrated diffuse disease to his iliac system and near occlusion of his left common femoral artery. He is therefore indicated for the above procedure.  Findings: There was significant soft plaque meter weekly occluding his left common femoral artery extending up into his external iliac artery and distally into his SFA and profunda femoris arteries following endarterectomy patch angioplasty he had strong multiphasic signals at the level of the PT and DP arteries.   Procedure:  The patient was identified in the holding area and taken to the operating room placed supine on the operating table and general anesthesia was induced. He was prepped and draped in the usual fashion timeout called. Made a longitudinal incision overlying the common femoral artery up over the inguinal ligament and dissected down to the level of the inguinal ligament. Subcutaneous tissue was divided down the level, femoral artery. We the mobilized common femoral and the external iliac artery. We dissected our profunda femoris arteries as well as our SFA since her Caroli's as well as a branch vessel loops. Patient was heparinized. The outflow was clamped followed by the inflow of the external iliac artery. We opened longitudinally with the above findings. Then performed endarterectomy. Distally we tacked the plaque down into the SFA. We then performed patch angioplasty with long bovine pericardium. Prior to releasing our clamps we allowed flushing maneuvers. We then released our clamps he did not have any significant bleeding.  Protamine was administered we did place no on top of the vessel. Hemostasis was obtained the wound was irrigated we then closed in layers with Vicryl Monocryl at the level of the skin and Dermabond above that. Patient is laterally from anesthesia having tolerated the procedure well without immediate consultation.    Joany Khatib C. [name], MD [facility] [place] Office: [phone] Pager: [phone]

## 2016-01-31 NOTE — Anesthesia Procedure Notes (Signed)
Procedure Name: Intubation Date/Time: 01/31/2016 2:29 PM Performed by: Reine JustFLOWERS, Octavis Sheeler T Pre-anesthesia Checklist: Patient identified, Emergency Drugs available, Suction available, Patient being monitored and Timeout performed Patient Re-evaluated:Patient Re-evaluated prior to inductionOxygen Delivery Method: Circle system utilized and Simple face mask Preoxygenation: Pre-oxygenation with 100% oxygen Intubation Type: IV induction Ventilation: Mask ventilation without difficulty Laryngoscope Size: Miller and 3 Grade View: Grade II Tube type: Oral Tube size: 7.5 mm Number of attempts: 1 Airway Equipment and Method: Patient positioned with wedge pillow and Stylet Placement Confirmation: ETT inserted through vocal cords under direct vision,  positive ETCO2 and breath sounds checked- equal and bilateral Secured at: 23 cm Tube secured with: Tape Dental Injury: Teeth and Oropharynx as per pre-operative assessment

## 2016-01-31 NOTE — Transfer of Care (Signed)
Immediate Anesthesia Transfer of Care Note  Patient: Marcus Glass  Procedure(s) Performed: Procedure(s): LEFT FEMORAL ENDARTERECTOMY (Left)  Patient Location: PACU  Anesthesia Type:General  Level of Consciousness: awake and alert   Airway & Oxygen Therapy: Patient Spontanous Breathing and Patient connected to nasal cannula oxygen  Post-op Assessment: Report given to RN and Post -op Vital signs reviewed and stable  Post vital signs: Reviewed and stable  Last Vitals:  Vitals:   01/31/16 0414 01/31/16 1645  BP: (!) 139/51 119/67  Pulse: 61 78  Resp: 18 16  Temp: 36.3 C 37.4 C    Last Pain:  Vitals:   01/31/16 1645  TempSrc:   PainSc: Asleep      Patients Stated Pain Goal: 0 (01/30/16 1950)  Complications: No apparent anesthesia complications

## 2016-01-31 NOTE — Anesthesia Preprocedure Evaluation (Signed)
Anesthesia Evaluation  Patient identified by MRN, date of birth, ID band Patient awake    Reviewed: Allergy & Precautions, H&P , NPO status , Patient's Chart, lab work & pertinent test results  Airway Mallampati: II   Neck ROM: full    Dental   Pulmonary Current Smoker,    breath sounds clear to auscultation       Cardiovascular hypertension, + CAD, + Past MI, + CABG and + Peripheral Vascular Disease   Rhythm:regular Rate:Normal     Neuro/Psych PSYCHIATRIC DISORDERS Anxiety Depression    GI/Hepatic   Endo/Other  diabetes, Type 2  Renal/GU      Musculoskeletal   Abdominal   Peds  Hematology   Anesthesia Other Findings   Reproductive/Obstetrics                             Anesthesia Physical Anesthesia Plan  ASA: III  Anesthesia Plan: General   Post-op Pain Management:    Induction: Intravenous  Airway Management Planned: Oral ETT  Additional Equipment:   Intra-op Plan:   Post-operative Plan: Extubation in OR  Informed Consent: I have reviewed the patients History and Physical, chart, labs and discussed the procedure including the risks, benefits and alternatives for the proposed anesthesia with the patient or authorized representative who has indicated his/her understanding and acceptance.     Plan Discussed with: CRNA, Anesthesiologist and Surgeon  Anesthesia Plan Comments:         Anesthesia Quick Evaluation

## 2016-01-31 NOTE — Addendum Note (Signed)
Addendum  created 01/31/16 1900 by Reine Justokoshi T Zaydn Gutridge, CRNA   Anesthesia Event edited

## 2016-01-31 NOTE — Progress Notes (Signed)
ANTICOAGULATION CONSULT NOTE - Follow Up Consult  Pharmacy Consult:  Heparin Indication: atrial fibrillation  Allergies  Allergen Reactions  . No Known Allergies     Patient Measurements: Heparin Dosing Weight: 93.6 kg   Vital Signs: Temp: 97.4 F (36.3 C) (01/26 0414) Temp Source: Oral (01/26 0414) BP: 139/51 (01/26 0414) Pulse Rate: 61 (01/26 0414)  Labs:  Recent Labs  01/29/16 0314 01/30/16 0344 01/30/16 1128 01/30/16 2025 01/31/16 0338  HGB 15.3 15.1  --   --  17.2*  HCT 43.2 43.9  --   --  48.7  PLT 172 186  --   --  217  LABPROT 14.0  --   --   --  12.9  INR 1.08  --   --   --  0.97  HEPARINUNFRC 0.27* 0.18* 0.28* 0.27* 0.45  CREATININE 1.06 0.88  --   --  1.10    Estimated Creatinine Clearance: 92.7 mL/min (by C-G formula based on SCr of 1.1 mg/dL).   Assessment: 56 y/o M here with lower leg pain, on apixaban PTA for afib, holding apixaban and starting heparin in anticipation of vascular procedure,   Anticoag: Heparin for Afib while Eliquis on hold, s/p angiogram on 1/24, Doppler negative for DVT. - HL 0.45 in goal, CBC WNL - 1/26: OR today for left femoral endarterectomy with patch angioplasty  Goal of Therapy:  HL 0.3 - 0.7 units/mL Monitor platelets by anticoagulation protocol: Yes   Plan:  -Heparin 2350 units/hr -Daily HL, CBC - Will f/u after OR   Marcus Glass, PharmD, [facility] Center[facility] Clinical Staff Pharmacist Pager [phone]  01/31/2016 1:05 PM

## 2016-01-31 NOTE — Anesthesia Postprocedure Evaluation (Signed)
Anesthesia Post Note  Patient: Marcus Glass  Procedure(s) Performed: Procedure(s) (LRB): LEFT FEMORAL ENDARTERECTOMY (Left)  Patient location during evaluation: PACU Anesthesia Type: General Level of consciousness: sedated, patient cooperative and oriented Pain management: pain level controlled Vital Signs Assessment: post-procedure vital signs reviewed and stable Respiratory status: spontaneous breathing, nonlabored ventilation, respiratory function stable and patient connected to nasal cannula oxygen Cardiovascular status: blood pressure returned to baseline and stable Postop Assessment: no signs of nausea or vomiting Anesthetic complications: no       Last Vitals:  Vitals:   01/31/16 1715 01/31/16 1718  BP:  115/62  Pulse: 84 83  Resp: 10 10  Temp:      Last Pain:  Vitals:   01/31/16 1755  TempSrc:   PainSc: Asleep                 Jeydan Barner,E. Shawntina Diffee

## 2016-02-01 LAB — CBC
HEMATOCRIT: 43.7 % (ref 39.0–52.0)
HEMOGLOBIN: 14.7 g/dL (ref 13.0–17.0)
MCH: 31.4 pg (ref 26.0–34.0)
MCHC: 33.6 g/dL (ref 30.0–36.0)
MCV: 93.4 fL (ref 78.0–100.0)
Platelets: 183 10*3/uL (ref 150–400)
RBC: 4.68 MIL/uL (ref 4.22–5.81)
RDW: 13.7 % (ref 11.5–15.5)
WBC: 14.1 10*3/uL — ABNORMAL HIGH (ref 4.0–10.5)

## 2016-02-01 LAB — GLUCOSE, CAPILLARY
GLUCOSE-CAPILLARY: 182 mg/dL — AB (ref 65–99)
Glucose-Capillary: 147 mg/dL — ABNORMAL HIGH (ref 65–99)
Glucose-Capillary: 223 mg/dL — ABNORMAL HIGH (ref 65–99)
Glucose-Capillary: 218 mg/dL — ABNORMAL HIGH (ref 65–99)

## 2016-02-01 LAB — BASIC METABOLIC PANEL
Anion gap: 7 (ref 5–15)
BUN: 5 mg/dL — ABNORMAL LOW (ref 6–20)
CHLORIDE: 99 mmol/L — AB (ref 101–111)
CO2: 28 mmol/L (ref 22–32)
CREATININE: 1.03 mg/dL (ref 0.61–1.24)
Calcium: 8.4 mg/dL — ABNORMAL LOW (ref 8.9–10.3)
GFR calc non Af Amer: >60 mL/min (ref >60)
Glucose, Bld: 175 mg/dL — ABNORMAL HIGH (ref 65–99)
POTASSIUM: 4.2 mmol/L (ref 3.5–5.1)
Sodium: 134 mmol/L — ABNORMAL LOW (ref 135–145)

## 2016-02-01 LAB — HEPARIN LEVEL (UNFRACTIONATED): Heparin Unfractionated: 0.31 IU/mL (ref 0.30–0.70)

## 2016-02-01 MED ORDER — OXYCODONE-ACETAMINOPHEN 5-325 MG PO TABS
1.0000 | ORAL_TABLET | ORAL | Status: DC | PRN
  Administered 2016-02-01 – 2016-02-03 (×10): 2 via ORAL
  Filled 2016-02-01 (×10): qty 2

## 2016-02-01 NOTE — Progress Notes (Signed)
Pt transferred to 2w09 per MD order. Report called to receiving nurse and all questions answered. Wife at bedside during transfer and aware of change in room.

## 2016-02-01 NOTE — Progress Notes (Addendum)
[facility]  Subjective  - Resting when I entered the room.  He states he has had some pain issues and needed Morphine after walking to the bathroom due to incisional pain.  Better now.   Objective (!) 97/56 61 98.3 F (36.8 C) (Oral) (!) 9 96%  Intake/Output Summary (Last 24 hours) at 02/01/16 1010 Last data filed at 02/01/16 0600  Gross per 24 hour  Intake          3191.35 ml  Output              850 ml  Net          2341.35 ml    Palpable DP pulse Left groin soft healing well Herat RRR, history of A fib Lungs non labored breathing   Assessment/Planning: POD # 1 left common femoral endarterectomy with bovine pericardial patch angioplasty   Pain medication changed to percocet hydrocodone D/C'd, with Morphine for break through pain. Hypotensive continue IV fluids 125 cc/hr, HGB 14.7 Will keep him one more night for observation   Clinton GallantCOLLINS, EMMA [facility] CenterMAUREEN 02/01/2016 10:10 AM --  Laboratory Lab Results:  Recent Labs  01/31/16 0338 02/01/16 0310  WBC 17.1* 14.1*  HGB 17.2* 14.7  HCT 48.7 43.7  PLT 217 183   BMET  Recent Labs  01/31/16 0338 02/01/16 0310  NA 131* 134*  K 4.7 4.2  CL 91* 99*  CO2 25 28  GLUCOSE 331* 175*  BUN 7 5*  CREATININE 1.10 1.03  CALCIUM 9.4 8.4*    COAG Lab Results  Component Value Date   INR 0.97 01/31/2016   INR 1.08 01/29/2016   INR 1.05 01/25/2016   No results found for: PTT  I have independently interviewed patient and agree with PA assessment and plan above. Strong multiphasic dp and pt signals on left. Ok to transition back to eliquis. Needs at lease one more day for pain control.  Taraoluwa Thakur C. [name], MD [facility] [place] Office: [phone] Pager: [phone]

## 2016-02-01 NOTE — Progress Notes (Signed)
PT Cancellation Note  Patient Details Name: Marcus RoseWilliam Glass MRN: [id number] DOB: 12/12/1960   Cancelled Treatment:    Reason Eval/Treat Not Completed: Pain limiting ability to participate;Patient declined, no reason specified. PT will continue to f/u with pt as appropriate and available.   Alessandra BevelsJennifer M Jamaury Gumz 02/01/2016, 9:25 AM

## 2016-02-01 NOTE — Progress Notes (Signed)
ANTICOAGULATION CONSULT NOTE - Follow Up Consult  Pharmacy Consult:  Heparin Indication: atrial fibrillation  Allergies  Allergen Reactions  . No Known Allergies     Patient Measurements: Heparin Dosing Weight: 93.6 kg   Vital Signs: Temp: 98.3 F (36.8 C) (01/27 0700) Temp Source: Oral (01/27 0700) BP: 102/54 (01/27 0325) Pulse Rate: 67 (01/27 0325)  Labs:  Recent Labs  01/30/16 0344  01/30/16 2025 01/31/16 0338 02/01/16 0310  HGB 15.1  --   --  17.2* 14.7  HCT 43.9  --   --  48.7 43.7  PLT 186  --   --  217 183  LABPROT  --   --   --  12.9  --   INR  --   --   --  0.97  --   HEPARINUNFRC 0.18*  < > 0.27* 0.45 0.31  CREATININE 0.88  --   --  1.10 1.03  < > = values in this interval not displayed.  Estimated Creatinine Clearance: 102.9 mL/min (by C-G formula based on SCr of 1.03 mg/dL).   Assessment: 56 y/o M here with lower leg pain, on apixaban PTA for afib - holding and started heparin in anticipation of vascular procedure. Dopplers negative for DVT. S/p angiogram on 1/24, now s/p OR 1/26 for L femoral endarterectomy with patch angioplasty. Pharmacy consulted to resume heparin at 2200 on 1/26 per Vascular. CBC wnl. Previously therapeutic on heparin at 2350 units/h. HL 0.31 in goal this AM (drawn 1.5 hours early), CBC WNL  Goal of Therapy:  HL 0.3 - 0.7 units/mL Monitor platelets by anticoagulation protocol: Yes   Plan:  -Continue heparin at 2350 units/hr  -Daily heparin level, CBC -Monitor for s/sx bleeding  Gwyndolyn KaufmanKai Cariann Kinnamon Bernette Redbird(Kenny), PharmD  PGY1 Pharmacy Resident Pager: [phone] 02/01/2016 8:43 AM

## 2016-02-01 NOTE — Progress Notes (Signed)
PROGRESS NOTE  Marcus Glass ZOX:[id number] DOB: [birth year] DOA: 01/25/2016 PCP: Paulina Fusi, MD   LOS: 6 days   Brief Narrative: This is a 56 y.o. Male who was admitted for significant lower extremity pain on 01/25/2016.  Patient was found to have peripheral vascular disease with stenosis in both lower extremities.  PMH significant for CAD s/p CABG 2016, peripheral vascular disease, a. Fib, hypertension, type II diabetes mellitus and hyperlipidemia. He is s/p left femoral endarterectomy 1/26.  Assessment & Plan: Principal Problem:   Intractable pain Active Problems:   Vascular claudication (HCC)   PAD (peripheral artery disease) (HCC)   Bed sore on buttock   Diabetes mellitus type II, non insulin dependent (HCC)   CAD (coronary artery disease)   PAF (paroxysmal atrial fibrillation) (HCC)   Hyponatremia   Chronic diastolic CHF (congestive heart failure) (HCC)   Symptomatic peripheral vascular disease- Surgery consulted, appreciate input. Patient underwent an angiogram 1/24 which showed significant disease, with aortobiiliac segments diffusely diseased without flow-limiting stenosis, however the left hypogastric artery is occluded. tight stenosis on the left side with greater than 80% of both common femoral and profunda femoris arteries. He is now status post left femoral endarterectomy on 1/26  Diabetes mellitus type II (HCC)- CBG is finally better controlled, continue Lantus and sliding scale  Chronic systolicCHF (EF 40-45% in 2016)(HCC) - A bit hypotensive this morning, IV fluids per vascular surgery. We'll hold Lasix for now. He appears euvolemic. - Repeat 2-D echo on 1/25 with EF of 40-45%, no significant changes as to the one in the past.  Coronary artery disease s/p CABG 2016- monitor, no chest pain  Paroxysmal atrial fibrillation (HCC) - currently in sinus, on Eliquis at home and currently on heparin. Continue amiodarone  Hypokalemia has resolved- Continue to  monitor.   Hypertension - Continue Coreg, Lasix  Hyperlipidemia  - continue Lipitor   DVT prophylaxis: Heparin Code Status: Full code Family Communication: Wife at bedside.  Disposition Plan: Home when ready, transfer to 2W today    Consultants:   Vascular Surgery  Procedures:   Left femoral endarterectomy scheduled for 01/31/2016.  Antimicrobials:  None.   Subjective: - complains of "soreness" left thigh. No chest pain, no breathing difficulties   Objective: Vitals:   02/01/16 0325 02/01/16 0500 02/01/16 0700 02/01/16 0727  BP: (!) 102/54   (!) 97/56  Pulse: 67   61  Resp: 19   (!) 9  Temp: 97.9 F (36.6 C)  98.3 F (36.8 C)   TempSrc: Oral  Oral   SpO2: 99%   96%  Weight:  121.8 kg (268 lb 9.6 oz)    Height:        Intake/Output Summary (Last 24 hours) at 02/01/16 1110 Last data filed at 02/01/16 0600  Gross per 24 hour  Intake          3191.35 ml  Output              850 ml  Net          2341.35 ml   Filed Weights   01/26/16 0900 01/30/16 0636 02/01/16 0500  Weight: 112.5 kg (248 lb) 113.3 kg (249 lb 12.8 oz) 121.8 kg (268 lb 9.6 oz)    Examination: Constitutional: NAD Vitals:   02/01/16 0325 02/01/16 0500 02/01/16 0700 02/01/16 0727  BP: (!) 102/54   (!) 97/56  Pulse: 67   61  Resp: 19   (!) 9  Temp: 97.9 F (36.6 C)  98.3 F (  36.8 C)   TempSrc: Oral  Oral   SpO2: 99%   96%  Weight:  121.8 kg (268 lb 9.6 oz)    Height:       Eyes:  lids and conjunctivae normal Respiratory: clear to auscultation bilaterally, no wheezing, no crackles.  Cardiovascular: Regular rate and rhythm, no murmurs / rubs / gallops. No LE edema. 1+ pedal pulses.  Abdomen: no tenderness. Bowel sounds positive.  Skin: small sacral pressure ulcer stage 1. Groin incision on left, healing Neurologic: No focal deficits.  Psychiatric: Normal judgment and insight. Alert and oriented x 3. Normal mood.    Data Reviewed: I have personally reviewed following labs and imaging  studies  CBC:  Recent Labs Lab 01/25/16 1936  01/28/16 0438 01/29/16 0314 01/30/16 0344 01/31/16 0338 02/01/16 0310  WBC 12.7*  < > 9.3 9.8 12.5* 17.1* 14.1*  NEUTROABS 8.2*  --   --   --   --   --   --   HGB 16.8  < > 15.1 15.3 15.1 17.2* 14.7  HCT 47.4  < > 43.7 43.2 43.9 48.7 43.7  MCV 90.3  < > 91.0 90.6 91.1 90.5 93.4  PLT 218  < > 187 172 186 217 183  < > = values in this interval not displayed. Basic Metabolic Panel:  Recent Labs Lab 01/28/16 0438 01/29/16 0314 01/30/16 0344 01/31/16 0338 02/01/16 0310  NA 132* 134* 133* 131* 134*  K 3.1* 3.9 3.7 4.7 4.2  CL 95* 98* 99* 91* 99*  CO2 28 28 25 25 28   GLUCOSE 308* 307* 293* 331* 175*  BUN 10 9 5* 7 5*  CREATININE 0.97 1.06 0.88 1.10 1.03  CALCIUM 8.5* 8.5* 8.3* 9.4 8.4*   GFR: Estimated Creatinine Clearance: 102.9 mL/min (by C-G formula based on SCr of 1.03 mg/dL). Liver Function Tests:  Recent Labs Lab 01/25/16 1936  AST 30  ALT 29  ALKPHOS 110  BILITOT 1.0  PROT 6.6  ALBUMIN 4.0   No results for input(s): LIPASE, AMYLASE in the last 168 hours. No results for input(s): AMMONIA in the last 168 hours. Coagulation Profile:  Recent Labs Lab 01/25/16 1936 01/29/16 0314 01/31/16 0338  INR 1.05 1.08 0.97   Cardiac Enzymes:  Recent Labs Lab 01/26/16 0919  CKTOTAL 90   BNP (last 3 results) No results for input(s): PROBNP in the last 8760 hours. HbA1C: No results for input(s): HGBA1C in the last 72 hours. CBG:  Recent Labs Lab 01/31/16 0628 01/31/16 1114 01/31/16 1651 01/31/16 2131 02/01/16 0815  GLUCAP 290* 223* 189* 208* 147*   Lipid Profile: No results for input(s): CHOL, HDL, LDLCALC, TRIG, CHOLHDL, LDLDIRECT in the last 72 hours. Thyroid Function Tests: No results for input(s): TSH, T4TOTAL, FREET4, T3FREE, THYROIDAB in the last 72 hours. Anemia Panel: No results for input(s): VITAMINB12, FOLATE, FERRITIN, TIBC, IRON, RETICCTPCT in the last 72 hours. Urine analysis:      Component Value Date/Time   COLORURINE YELLOW 01/26/2016 0715   APPEARANCEUR CLEAR 01/26/2016 0715   LABSPEC 1.042 (H) 01/26/2016 0715   PHURINE 6.0 01/26/2016 0715   GLUCOSEU >=500 (A) 01/26/2016 0715   HGBUR NEGATIVE 01/26/2016 0715   BILIRUBINUR NEGATIVE 01/26/2016 0715   KETONESUR NEGATIVE 01/26/2016 0715   PROTEINUR NEGATIVE 01/26/2016 0715   NITRITE NEGATIVE 01/26/2016 0715   LEUKOCYTESUR NEGATIVE 01/26/2016 0715   Sepsis Labs: Invalid input(s): PROCALCITONIN, LACTICIDVEN  Recent Results (from the past 240 hour(s))  MRSA PCR Screening     Status: None  Collection Time: 01/29/16  8:00 AM  Result Value Ref Range Status   MRSA by PCR NEGATIVE NEGATIVE Final    Comment:        The GeneXpert MRSA Assay (FDA approved for NASAL specimens only), is one component of a comprehensive MRSA colonization surveillance program. It is not intended to diagnose MRSA infection nor to guide or monitor treatment for MRSA infections.       Radiology Studies: No results found.   Scheduled Meds: . amiodarone  200 mg Oral Daily  . atorvastatin  80 mg Oral Daily  . carvedilol  3.125 mg Oral BID WC  . clonazePAM  1 mg Oral BID  . docusate sodium  100 mg Oral Daily  . famotidine  20 mg Oral BID  . furosemide  60 mg Oral BID  . insulin aspart  0-20 Units Subcutaneous TID WC  . insulin aspart  0-5 Units Subcutaneous QHS  . insulin aspart  4 Units Subcutaneous TID WC  . insulin glargine  33 Units Subcutaneous Daily  . methocarbamol  500 mg Oral TID  . omega-3 acid ethyl esters  1 g Oral BID  . pantoprazole  40 mg Oral Daily  . potassium chloride SA  40 mEq Oral Daily   Continuous Infusions: . sodium chloride 1,000 mL (02/01/16 0343)  . heparin 2,350 Units/hr (01/31/16 2234)  . lactated ringers 10 mL/hr at 01/31/16 1347   Pamella Pert, MD, PhD [facility] Pager [phone]  If 7PM-7AM, please contact night-coverage [email or web address] Password [email or web address] 02/01/2016, 11:10 AM

## 2016-02-02 LAB — CBC
HCT: 37.6 % — ABNORMAL LOW (ref 39.0–52.0)
HEMOGLOBIN: 12.9 g/dL — AB (ref 13.0–17.0)
MCH: 32 pg (ref 26.0–34.0)
MCHC: 34.3 g/dL (ref 30.0–36.0)
MCV: 93.3 fL (ref 78.0–100.0)
Platelets: 177 10*3/uL (ref 150–400)
RBC: 4.03 MIL/uL — ABNORMAL LOW (ref 4.22–5.81)
RDW: 13.7 % (ref 11.5–15.5)
WBC: 12.5 10*3/uL — ABNORMAL HIGH (ref 4.0–10.5)

## 2016-02-02 LAB — BASIC METABOLIC PANEL
Anion gap: 10 (ref 5–15)
BUN: 6 mg/dL (ref 6–20)
CHLORIDE: 101 mmol/L (ref 101–111)
CO2: 22 mmol/L (ref 22–32)
CREATININE: 0.92 mg/dL (ref 0.61–1.24)
Calcium: 8.2 mg/dL — ABNORMAL LOW (ref 8.9–10.3)
GFR calc Af Amer: >60 mL/min (ref >60)
GLUCOSE: 216 mg/dL — AB (ref 65–99)
POTASSIUM: 4 mmol/L (ref 3.5–5.1)
Sodium: 133 mmol/L — ABNORMAL LOW (ref 135–145)

## 2016-02-02 LAB — GLUCOSE, CAPILLARY
GLUCOSE-CAPILLARY: 162 mg/dL — AB (ref 65–99)
GLUCOSE-CAPILLARY: 161 mg/dL — AB (ref 65–99)
Glucose-Capillary: 192 mg/dL — ABNORMAL HIGH (ref 65–99)
Glucose-Capillary: 222 mg/dL — ABNORMAL HIGH (ref 65–99)

## 2016-02-02 LAB — HEPARIN LEVEL (UNFRACTIONATED): Heparin Unfractionated: <0.1 IU/mL — ABNORMAL LOW (ref 0.30–0.70)

## 2016-02-02 MED ORDER — APIXABAN 5 MG PO TABS
5.0000 mg | ORAL_TABLET | Freq: Two times a day (BID) | ORAL | Status: DC
  Administered 2016-02-02 – 2016-02-03 (×3): 5 mg via ORAL
  Filled 2016-02-02 (×3): qty 1

## 2016-02-02 NOTE — Progress Notes (Signed)
ANTICOAGULATION CONSULT NOTE - Follow Up Consult  Pharmacy Consult:  Heparin Indication: atrial fibrillation  Allergies  Allergen Reactions  . No Known Allergies     Patient Measurements: Heparin Dosing Weight: 93.6 kg   Vital Signs: Temp: 98.5 F (36.9 C) (01/27 2100) Temp Source: Oral (01/27 2100) BP: 102/55 (01/27 2100)  Labs:  Recent Labs  01/31/16 0338 02/01/16 0310 02/02/16 0322  HGB 17.2* 14.7 12.9*  HCT 48.7 43.7 37.6*  PLT 217 183 177  LABPROT 12.9  --   --   INR 0.97  --   --   HEPARINUNFRC 0.45 0.31 <0.10*  CREATININE 1.10 1.03 0.92    Estimated Creatinine Clearance: 115.2 mL/min (by C-G formula based on SCr of 0.92 mg/dL).  Assessment: 56 y/o M here with lower leg pain, on apixaban PTA for afib - holding and started heparin in anticipation of vascular procedure. Dopplers negative for DVT. S/p angiogram on 1/24, now s/p OR 1/26 for L femoral endarterectomy with patch angioplasty. Pharmacy consulted to resume heparin at 2200 on 1/26 per Vascular. Heparin level undetectable this morning on 2350 units/hr - had previously been therapeutic at this rate. RN says that patient may have been changing the pump settings earlier in the evening but now she has the pumps locked.  Goal of Therapy:  Heparin level 0.3 - 0.7 units/mL Monitor platelets by anticoagulation protocol: Yes   Plan:  - Increase heparin slightly to 2500 units/hr  - F/u ability to transition to Eliquis today (ok per [facility]VS)  Christoper Fabianaron Khallid Pasillas, PharmD, BCPS Clinical pharmacist, pager [phone] 02/02/2016 5:08 AM

## 2016-02-02 NOTE — Evaluation (Signed)
Occupational Therapy Evaluation Patient Details Name: Marcus Glass MRN: [id number] DOB: 05/29/1960 Today's Date: 02/02/2016    History of Present Illness 56 yo male s/p L femoral endarterectomy 1/26  Past Medical History:  Diagnosis Date  . Acute ST-segment elevation myocardial infarction (HCC)   . Anxiety   . Bilateral leg edema   . Bilateral leg edema   . CAD (coronary artery disease)   . CHF (congestive heart failure) (HCC)   . Chronic venous insufficiency   . Clinical depression   . Diabetes mellitus without complication (HCC)   . Falling episodes   . Hyperlipidemia   . Hypertension   . Hypokalemia   . Left ventricular systolic dysfunction   . Paroxysmal atrial fibrillation (HCC)   . Sternum pain       Clinical Impression   Patient evaluated by Occupational Therapy with no further acute OT needs identified. All education has been completed and the patient has no further questions. See below for any follow-up Occupational Therapy or equipment needs. OT to sign off. Thank you for referral.      Follow Up Recommendations  No OT follow up    Equipment Recommendations  Other (comment) (RW)    Recommendations for Other Services       Precautions / Restrictions Precautions Precautions: None      Mobility Bed Mobility Overal bed mobility: Independent             General bed mobility comments: baseline sleep on a couch per wife with head elevatede  Transfers Overall transfer level: Independent                    Balance                                            ADL Overall ADL's : Independent                                       General ADL Comments: Pt don bil socks, completed basic transfer, bed mobility sink level adl and safe RW use     Vision     Perception     Praxis      Pertinent Vitals/Pain Pain Assessment: 0-10 Pain Score: 8  Pain Location: L LE Pain Descriptors / Indicators:  Discomfort;Constant Pain Intervention(s): Limited activity within patient's tolerance;Monitored during session;Repositioned     Hand Dominance Right   Extremity/Trunk Assessment Upper Extremity Assessment Upper Extremity Assessment: Overall WFL for tasks assessed   Lower Extremity Assessment Lower Extremity Assessment: Defer to PT evaluation   Cervical / Trunk Assessment Cervical / Trunk Assessment: Other exceptions Cervical / Trunk Exceptions: rounded elevated shoulders. wife reports he has been like that sinck his CABG. pt educated on depression of scapula and attemping to stand against a wall and push shoulders back into wall as a stretch exercises   Communication Communication Communication: No difficulties   Cognition Arousal/Alertness: Awake/alert Behavior During Therapy: WFL for tasks assessed/performed Overall Cognitive Status: Within Functional Limits for tasks assessed                     General Comments       Exercises       Shoulder Instructions      Home Living  Family/patient expects to be discharged to:: Private residence Living Arrangements: Spouse/significant other Available Help at Discharge: Family;Available 24 hours/day Type of Home: House Home Access: Ramped entrance     Home Layout: One level     Bathroom Shower/Tub: Producer, television/film/video: Handicapped height     Home Equipment: None   Additional Comments: 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 wife works as Diplomatic Services operational officer      Prior Functioning/Environment Level of Independence: Independent                 OT Problem List:     OT Treatment/Interventions:      OT Goals(Current goals can be found in the care plan section) Acute Rehab OT Goals Patient Stated Goal: to return home Potential to Achieve Goals: Good  OT Frequency:     Barriers to D/C:            Co-evaluation              End of Session Equipment Utilized During Treatment: Gait belt;Rolling  walker Nurse Communication: Mobility status;Precautions  Activity Tolerance: Patient tolerated treatment well Patient left: in chair;with call bell/phone within reach;with family/visitor present   Time: 0700-0733 OT Time Calculation (min): 33 min Charges:  OT General Charges $OT Visit: 1 Procedure OT Evaluation $OT Eval Moderate Complexity: 1 Procedure OT Treatments $Self Care/Home Management : 8-22 mins G-Codes:    Marcus Glass Mar 01, 2016, 7:44 AM   Marcus Glass   OTR/L Pager: [phone] Office: [phone] .

## 2016-02-02 NOTE — Progress Notes (Signed)
Mr Marcus Glass states that he no longer takes Pepcid or robaxin and has been refusing the medications

## 2016-02-02 NOTE — Progress Notes (Signed)
PT Cancellation Note  Patient Details Name: Marcus RoseWilliam Glass MRN: [id number] DOB: 12/25/1960   Cancelled Treatment:    Reason Eval/Treat Not Completed: Other (comment)   Politely declining PT due to pain;   Will follow up later today as time allows;  Otherwise, will follow up for PT tomorrow;   Thank you,  Van ClinesHolly Natassja Ollis, PT  Acute Rehabilitation Services Pager [phone] Office [phone]    Levi AlandHolly H Kijuana Ruppel 02/02/2016, 3:51 PM

## 2016-02-02 NOTE — Progress Notes (Signed)
ANTICOAGULATION CONSULT NOTE - Follow Up Consult  Pharmacy Consult:  Heparin/Apixaban Indication: atrial fibrillation  Allergies  Allergen Reactions  . No Known Allergies     Patient Measurements: Heparin Dosing Weight: 93.6 kg   Vital Signs: Temp: 97.7 F (36.5 C) (01/28 0527) Temp Source: Oral (01/28 0527) BP: 102/55 (01/28 0527) Pulse Rate: 66 (01/28 0527)  Labs:  Recent Labs  01/31/16 0338 02/01/16 0310 02/02/16 0322  HGB 17.2* 14.7 12.9*  HCT 48.7 43.7 37.6*  PLT 217 183 177  LABPROT 12.9  --   --   INR 0.97  --   --   HEPARINUNFRC 0.45 0.31 <0.10*  CREATININE 1.10 1.03 0.92    Estimated Creatinine Clearance: 115.2 mL/min (by C-G formula based on SCr of 0.92 mg/dL).  Assessment: 56 y/o M here with lower leg pain, on apixaban PTA for afib - holding and started heparin in anticipation of vascular procedure. Dopplers negative for DVT. S/p angiogram on 1/24, now s/p OR 1/26 for L femoral endarterectomy with patch angioplasty. Pharmacy consulted to resume heparin at 2200 on 1/26 per Vascular. Plan per MD is now to transition back to PTA apixaban.   Goal of Therapy:  Heparin level 0.3 - 0.7 units/mL Monitor platelets by anticoagulation protocol: Yes   Plan:  - Discontinue heparin - Initiate apixaban 5mg  PO BID  Fredonia HighlandMichael Georgann Bramble, PharmD PGY-1 Pharmacy Resident Pager: [phone] 02/02/2016

## 2016-02-02 NOTE — Progress Notes (Addendum)
[facility]  Subjective  - Doing better.  Ambulating more in the halls, pain improved.   Objective (!) 102/55 66 97.7 F (36.5 C) (Oral) 18 97%  Intake/Output Summary (Last 24 hours) at 02/02/16 1021 Last data filed at 02/01/16 2317  Gross per 24 hour  Intake          3526.57 ml  Output                0 ml  Net          3526.57 ml    Palpable DP pulse, Left LE edema min. Left groin soft, incision healing well Herat RRR, history of A fib Lungs non labored breathing  Assessment/Planning: POD # 2 left common femoral endarterectomy with bovine pericardial patch angioplasty  Plan to be discharged home tomorrow Cont. Aspirin and Eliquis F/U with Dr. [name] in 3 weeks with repeat ABI  Thomasena EdisCOLLINS, [facility] LLCEMMA [facility] HospitalMAUREEN 02/02/2016 10:21 AM --  Laboratory Lab Results:  Recent Labs  02/01/16 0310 02/02/16 0322  WBC 14.1* 12.5*  HGB 14.7 12.9*  HCT 43.7 37.6*  PLT 183 177   BMET  Recent Labs  02/01/16 0310 02/02/16 0322  NA 134* 133*  K 4.2 4.0  CL 99* 101  CO2 28 22  GLUCOSE 175* 216*  BUN 5* 6  CREATININE 1.03 0.92  CALCIUM 8.4* 8.2*    COAG Lab Results  Component Value Date   INR 0.97 01/31/2016   INR 1.08 01/29/2016   INR 1.05 01/25/2016   No results found for: PTT   I have independently interviewed patient and agree with PA assessment and plan above. Ok for transition to eliquis for afib. Can d/c when ok per primary. Encouraged ambulation today.  Fahmida Jurich C. [name], MD [facility] [place] Office: [phone] Pager: [phone]

## 2016-02-02 NOTE — Progress Notes (Signed)
PROGRESS NOTE  Marcus Glass ZOX:[id number] DOB: [birth year] DOA: 01/25/2016 PCP: Paulina Fusi, MD   LOS: 7 days   Brief Narrative: This is a 56 y.o. Male who was admitted for significant lower extremity pain on 01/25/2016.  Patient was found to have peripheral vascular disease with stenosis in both lower extremities.  PMH significant for CAD s/p CABG 2016, peripheral vascular disease, a. Fib, hypertension, type II diabetes mellitus and hyperlipidemia. He is s/p left femoral endarterectomy 1/26.  Assessment & Plan: Principal Problem:   Intractable pain Active Problems:   Vascular claudication (HCC)   PAD (peripheral artery disease) (HCC)   Bed sore on buttock   Diabetes mellitus type II, non insulin dependent (HCC)   CAD (coronary artery disease)   PAF (paroxysmal atrial fibrillation) (HCC)   Hyponatremia   Chronic diastolic CHF (congestive heart failure) (HCC)   Symptomatic peripheral vascular disease- Surgery consulted, appreciate input. Patient underwent an angiogram 1/24 which showed significant disease, with aortobiiliac segments diffusely diseased without flow-limiting stenosis, however the left hypogastric artery is occluded. tight stenosis on the left side with greater than 80% of both common femoral and profunda femoris arteries. He is now status post left femoral endarterectomy on 1/26. Recovering well, home tomorrow  Diabetes mellitus type II (HCC)- CBG is finally better controlled, continue Lantus and sliding scale  Chronic systolicCHF (EF 40-45% in 2016)(HCC) - appears euvolemic, he was hypotensive 1/27 when was placed on IV fluids and his Lasix was stopped. Discontinue fluids today, he has good by mouth intake, his blood pressure is normal. I will continue to hold Lasix and resume tomorrow - Repeat 2-D echo on 1/25 with EF of 40-45%, no significant changes as to the one in the past.  Coronary artery disease s/p CABG 2016- monitor, no chest pain  Paroxysmal  atrial fibrillation (HCC) - currently in sinus, on Eliquis at home and currently on heparin. Continue amiodarone  Hypokalemia has resolved- Continue to monitor.   Hypertension - Continue Coreg, Lasix  Hyperlipidemia  - continue Lipitor   DVT prophylaxis: Heparin Code Status: Full code Family Communication: Wife at bedside.  Disposition Plan: Home 1 day  Consultants:   Vascular Surgery  Procedures:   Left femoral endarterectomy scheduled for 01/31/2016.  Antimicrobials:  None.   Subjective: - Feels better, thigh pain has improved  Objective: Vitals:   02/01/16 1151 02/01/16 1345 02/01/16 2100 02/02/16 0527  BP: (!) 99/57 (!) 111/53 (!) 102/55 (!) 102/55  Pulse: 65 63  66  Resp: 14 18 18 18   Temp:  98.7 F (37.1 C) 98.5 F (36.9 C) 97.7 F (36.5 C)  TempSrc:  Oral Oral Oral  SpO2: 96% 96% 99% 97%  Weight:      Height:        Intake/Output Summary (Last 24 hours) at 02/02/16 1134 Last data filed at 02/01/16 2317  Gross per 24 hour  Intake          3526.57 ml  Output                0 ml  Net          3526.57 ml   Filed Weights   01/26/16 0900 01/30/16 0636 02/01/16 0500  Weight: 112.5 kg (248 lb) 113.3 kg (249 lb 12.8 oz) 121.8 kg (268 lb 9.6 oz)    Examination: Constitutional: NAD Vitals:   02/01/16 1151 02/01/16 1345 02/01/16 2100 02/02/16 0527  BP: (!) 99/57 (!) 111/53 (!) 102/55 (!) 102/55  Pulse: 65 63  66  Resp: 14 18 18 18   Temp:  98.7 F (37.1 C) 98.5 F (36.9 C) 97.7 F (36.5 C)  TempSrc:  Oral Oral Oral  SpO2: 96% 96% 99% 97%  Weight:      Height:       Eyes:  lids and conjunctivae normal Respiratory: clear to auscultation bilaterally, no wheezing, no crackles.  Cardiovascular: Regular rate and rhythm, no murmurs / rubs / gallops. No LE edema. 1+ pedal pulses.  Abdomen: no tenderness. Bowel sounds positive.  Skin: small sacral pressure ulcer stage 1. Groin incision on left, healing Neurologic: No focal deficits.  Psychiatric:  Normal judgment and insight. Alert and oriented x 3. Normal mood.    Data Reviewed: I have personally reviewed following labs and imaging studies  CBC:  Recent Labs Lab 01/29/16 0314 01/30/16 0344 01/31/16 0338 02/01/16 0310 02/02/16 0322  WBC 9.8 12.5* 17.1* 14.1* 12.5*  HGB 15.3 15.1 17.2* 14.7 12.9*  HCT 43.2 43.9 48.7 43.7 37.6*  MCV 90.6 91.1 90.5 93.4 93.3  PLT 172 186 217 183 177   Basic Metabolic Panel:  Recent Labs Lab 01/29/16 0314 01/30/16 0344 01/31/16 0338 02/01/16 0310 02/02/16 0322  NA 134* 133* 131* 134* 133*  K 3.9 3.7 4.7 4.2 4.0  CL 98* 99* 91* 99* 101  CO2 28 25 25 28 22   GLUCOSE 307* 293* 331* 175* 216*  BUN 9 5* 7 5* 6  CREATININE 1.06 0.88 1.10 1.03 0.92  CALCIUM 8.5* 8.3* 9.4 8.4* 8.2*   GFR: Estimated Creatinine Clearance: 115.2 mL/min (by C-G formula based on SCr of 0.92 mg/dL). Liver Function Tests: No results for input(s): AST, ALT, ALKPHOS, BILITOT, PROT, ALBUMIN in the last 168 hours. No results for input(s): LIPASE, AMYLASE in the last 168 hours. No results for input(s): AMMONIA in the last 168 hours. Coagulation Profile:  Recent Labs Lab 01/29/16 0314 01/31/16 0338  INR 1.08 0.97   Cardiac Enzymes: No results for input(s): CKTOTAL, CKMB, CKMBINDEX, TROPONINI in the last 168 hours. BNP (last 3 results) No results for input(s): PROBNP in the last 8760 hours. HbA1C: No results for input(s): HGBA1C in the last 72 hours. CBG:  Recent Labs Lab 02/01/16 1148 02/01/16 1656 02/01/16 2054 02/02/16 0605 02/02/16 1126  GLUCAP 182* 223* 218* 192* 222*   Lipid Profile: No results for input(s): CHOL, HDL, LDLCALC, TRIG, CHOLHDL, LDLDIRECT in the last 72 hours. Thyroid Function Tests: No results for input(s): TSH, T4TOTAL, FREET4, T3FREE, THYROIDAB in the last 72 hours. Anemia Panel: No results for input(s): VITAMINB12, FOLATE, FERRITIN, TIBC, IRON, RETICCTPCT in the last 72 hours. Urine analysis:    Component Value  Date/Time   COLORURINE YELLOW 01/26/2016 0715   APPEARANCEUR CLEAR 01/26/2016 0715   LABSPEC 1.042 (H) 01/26/2016 0715   PHURINE 6.0 01/26/2016 0715   GLUCOSEU >=500 (A) 01/26/2016 0715   HGBUR NEGATIVE 01/26/2016 0715   BILIRUBINUR NEGATIVE 01/26/2016 0715   KETONESUR NEGATIVE 01/26/2016 0715   PROTEINUR NEGATIVE 01/26/2016 0715   NITRITE NEGATIVE 01/26/2016 0715   LEUKOCYTESUR NEGATIVE 01/26/2016 0715   Sepsis Labs: Invalid input(s): PROCALCITONIN, LACTICIDVEN  Recent Results (from the past 240 hour(s))  MRSA PCR Screening     Status: None   Collection Time: 01/29/16  8:00 AM  Result Value Ref Range Status   MRSA by PCR NEGATIVE NEGATIVE Final    Comment:        The GeneXpert MRSA Assay (FDA approved for NASAL specimens only), is one component of a comprehensive MRSA colonization  surveillance program. It is not intended to diagnose MRSA infection nor to guide or monitor treatment for MRSA infections.       Radiology Studies: No results found.   Scheduled Meds: . amiodarone  200 mg Oral Daily  . apixaban  5 mg Oral BID  . atorvastatin  80 mg Oral Daily  . carvedilol  3.125 mg Oral BID WC  . clonazePAM  1 mg Oral BID  . docusate sodium  100 mg Oral Daily  . famotidine  20 mg Oral BID  . insulin aspart  0-20 Units Subcutaneous TID WC  . insulin aspart  0-5 Units Subcutaneous QHS  . insulin aspart  4 Units Subcutaneous TID WC  . insulin glargine  33 Units Subcutaneous Daily  . methocarbamol  500 mg Oral TID  . omega-3 acid ethyl esters  1 g Oral BID  . pantoprazole  40 mg Oral Daily  . potassium chloride SA  40 mEq Oral Daily   Continuous Infusions: . sodium chloride Stopped (02/02/16 0230)  . lactated ringers 10 mL/hr at 01/31/16 1347   Pamella Pertostin Gherghe, MD, PhD [facility] Pager [phone]  If 7PM-7AM, please contact night-coverage [email or web address] Password [email or web address] 02/02/2016, 11:34 AM

## 2016-02-03 ENCOUNTER — Encounter (HOSPITAL_COMMUNITY): Payer: Self-pay | Admitting: Vascular Surgery

## 2016-02-03 LAB — GLUCOSE, CAPILLARY
GLUCOSE-CAPILLARY: 166 mg/dL — AB (ref 65–99)
Glucose-Capillary: 152 mg/dL — ABNORMAL HIGH (ref 65–99)

## 2016-02-03 MED ORDER — INSULIN GLARGINE 100 UNIT/ML ~~LOC~~ SOLN: 20.0000 [IU] | Freq: Every day | SUBCUTANEOUS | 11 refills | Status: AC

## 2016-02-03 MED ORDER — INSULIN ASPART 100 UNIT/ML ~~LOC~~ SOLN: 4.0000 [IU] | Freq: Three times a day (TID) | SUBCUTANEOUS | 11 refills | Status: AC

## 2016-02-03 MED ORDER — OXYCODONE-ACETAMINOPHEN 5-325 MG PO TABS: 1.0000 | ORAL_TABLET | ORAL | 0 refills | Status: DC | PRN

## 2016-02-03 MED ORDER — FUROSEMIDE 80 MG PO TABS
120.0000 mg | ORAL_TABLET | Freq: Two times a day (BID) | ORAL | Status: DC
  Administered 2016-02-03: 120 mg via ORAL
  Filled 2016-02-03: qty 1

## 2016-02-03 NOTE — Discharge Instructions (Signed)
Accuchecks 4 times/day, Once in AM empty stomach and then before each meal. Log in all results and show them to your primary doctor in 3-5 days. If any glucose reading is under 80 or above 300 call your primary doctor immediately.  Follow with SCHULTZ,DOUGLAS E, MD in 1-2 weeks  Please get a complete blood count and chemistry panel checked by your Primary MD at your next visit, and again as instructed by your Primary MD. Please get your medications reviewed and adjusted by your Primary MD.  Please request your Primary MD to go over all Hospital Tests and Procedure/Radiological results at the follow up, please get all Hospital records sent to your Prim MD by signing hospital release before you go home.  If you had Pneumonia of Lung problems at the Hospital: Please get a 2 view Chest X ray done in 6-8 weeks after hospital discharge or sooner if instructed by your Primary MD.  If you have Congestive Heart Failure: Please call your Cardiologist or Primary MD anytime you have any of the following symptoms:  1) 3 pound weight gain in 24 hours or 5 pounds in 1 week  2) shortness of breath, with or without a dry hacking cough  3) swelling in the hands, feet or stomach  4) if you have to sleep on extra pillows at night in order to breathe  Follow cardiac low salt diet and 1.5 lit/day fluid restriction.  If you have diabetes Accuchecks 4 times/day, Once in AM empty stomach and then before each meal. Log in all results and show them to your primary doctor at your next visit. If any glucose reading is under 80 or above 300 call your primary MD immediately.  If you have Seizure/Convulsions/Epilepsy: Please do not drive, operate heavy machinery, participate in activities at heights or participate in high speed sports until you have seen by Primary MD or a Neurologist and advised to do so again.  If you had Gastrointestinal Bleeding: Please ask your Primary MD to check a complete blood count within  one week of discharge or at your next visit. Your endoscopic/colonoscopic biopsies that are pending at the time of discharge, will also need to followed by your Primary MD.  Get Medicines reviewed and adjusted. Please take all your medications with you for your next visit with your Primary MD  Please request your Primary MD to go over all hospital tests and procedure/radiological results at the follow up, please ask your Primary MD to get all Hospital records sent to his/her office.  If you experience worsening of your admission symptoms, develop shortness of breath, life threatening emergency, suicidal or homicidal thoughts you must seek medical attention immediately by calling 911 or calling your MD immediately  if symptoms less severe.  You must read complete instructions/literature along with all the possible adverse reactions/side effects for all the Medicines you take and that have been prescribed to you. Take any new Medicines after you have completely understood and accpet all the possible adverse reactions/side effects.   Do not drive or operate heavy machinery when taking Pain medications.   Do not take more than prescribed Pain, Sleep and Anxiety Medications  Special Instructions: If you have smoked or chewed Tobacco  in the last 2 yrs please stop smoking, stop any regular Alcohol  and or any Recreational drug use.  Wear Seat belts while driving.  Please note You were cared for by a hospitalist during your hospital stay. If you have any questions about your  discharge medications or the care you received while you were in the hospital after you are discharged, you can call the unit and asked to speak with the hospitalist on call if the hospitalist that took care of you is not available. Once you are discharged, your primary care physician will handle any further medical issues. Please note that NO REFILLS for any discharge medications will be authorized once you are discharged, as it is  imperative that you return to your primary care physician (or establish a relationship with a primary care physician if you do not have one) for your aftercare needs so that they can reassess your need for medications and monitor your lab values.  You can reach the hospitalist office at phone [phone] or fax [phone]   If you do not have a primary care physician, you can call [phone] for a physician referral.  Activity: As tolerated with Full fall precautions use walker/cane & assistance as needed  Diet: diabetic  Disposition Home

## 2016-02-03 NOTE — Progress Notes (Signed)
Pt has been discharged home with wife. IV and telemetry box removed. Pt and pt's wife received discharge instructions and all questions were answered. Pt received prescriptions and was instructed to get them filled at his pharmacy after discharge. Pt verbalized understanding. Pt left with all of his belongings. Pt left the unit via wheelchair and was accompanied by this RN and pt's nurse tech.  Berdine DanceLauren Moffitt BSN, RN

## 2016-02-03 NOTE — Progress Notes (Signed)
   01/31/16 1008  Clinical Encounter Type  Visited With Patient and family together  Visit Type Spiritual support  Spiritual Encounters  Spiritual Needs Ritual;Prayer  Stress Factors  Patient Stress Factors None identified  Family Stress Factors None identified  Visit took place at 1320 prior to surgery. Introduction to Pt and wife. Offered prayer of healing and anointing according to the [place] [employer] faith. Available as needed.

## 2016-02-03 NOTE — Evaluation (Signed)
Physical Therapy Evaluation and Discharge Patient Details Name: Marcus Glass MRN: [id number] DOB: [birth year] Today's Date: 02/03/2016   History of Present Illness  56 yo male s/p L femoral endarterectomy 1/26  Clinical Impression  Pt functioning at supervision level. Pt with 24/7 assist. Currently requires RW for safe ambulation due to bilat LE pain. Encouraged pt to amb as much as tolerable and complete bilat LE AROM. Pt with no further acute PT needs at this time. PT SIGNING OFF. Please re-consult if needed in future.    Follow Up Recommendations No PT follow up    Equipment Recommendations  Rolling walker with 5" wheels    Recommendations for Other Services       Precautions / Restrictions Precautions Precautions: None Restrictions Weight Bearing Restrictions: No      Mobility  Bed Mobility               General bed mobility comments: pt up in chair upon PT arrival  Transfers Overall transfer level: Modified independent Equipment used: Rolling walker (2 wheeled)             General transfer comment: pt demo'd good technique pushing up from chair and transitioning hands from chair to RW. increaed time due to L LE surgical pain  Ambulation/Gait Ambulation/Gait assistance: Modified independent (Device/Increase time) Ambulation Distance (Feet): 300 Feet Assistive device: Rolling walker (2 wheeled) Gait Pattern/deviations: Step-through pattern Gait velocity: decreased Gait velocity interpretation: Below normal speed for age/gender General Gait Details: incrased bilat UE WBing/trunk flexion. v/c's to increase back extension and LE wbing however pt limited by LE pain, no episodes of LOB  Stairs            Wheelchair Mobility    Modified Rankin (Stroke Patients Only)       Balance Overall balance assessment: Needs assistance         Standing balance support: No upper extremity supported Standing balance-Leahy Scale: Fair Standing balance  comment: benefits from RW however can stand without AD                             Pertinent Vitals/Pain Pain Assessment: 0-10 Pain Score: 6  Pain Location: L LE Pain Descriptors / Indicators: Discomfort;Constant Pain Intervention(s): Monitored during session    Home Living Family/patient expects to be discharged to:: Private residence Living Arrangements: Spouse/significant other Available Help at Discharge: Family;Available 24 hours/day Type of Home: House Home Access: Ramped entrance     Home Layout: One level Home Equipment: None Additional Comments: 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 wife reports he has been like that sinck his CABG. pt educated on depression of scapula and attemping to stand against a wall and push shoulders back into wall as a stretch exercises  Communication   Communication: No difficulties  Cognition Arousal/Alertness: Awake/alert Behavior During Therapy: WFL for tasks assessed/performed Overall Cognitive Status: Within Functional Limits for tasks assessed  General Comments General comments (skin integrity, edema, etc.): bilat LE edema    Exercises     Assessment/Plan    PT Assessment Patent does not need any further PT services  PT Problem List            PT Treatment Interventions      PT Goals (Current goals can be found in the Care Plan section)  Acute Rehab PT  Goals Patient Stated Goal: to return home PT Goal Formulation: All assessment and education complete, DC therapy    Frequency     Barriers to discharge        Co-evaluation               End of Session   Activity Tolerance: Patient tolerated treatment well Patient left: in chair;with family/visitor present Nurse Communication: Mobility status         Time: [id number] PT Time Calculation (min) (ACUTE ONLY): 31 min   Charges:   PT Evaluation $PT Eval Low Complexity: 1 Procedure PT Treatments $Gait Training: 8-22 mins   PT G CodesRozell Searing:        Kasten Leveque M Kathaleya Mcduffee 02/03/2016, 8:20 AM   Lewis ShockAshly Juel Ripley, PT, DPT Pager #: [phone] Office #: [phone]

## 2016-02-03 NOTE — Discharge Summary (Addendum)
Physician Discharge Summary  Marcus Glass WUJ:[id number] DOB: [birth year] DOA: 01/25/2016  PCP: Marcus Fusi, MD  Admit date: 01/25/2016 Discharge date: 02/03/2016  Admitted From: Home Disposition:  Home  Recommendations for Outpatient Follow-up:  1. Follow up with PCP in 1 week. 2. Please obtain BMP/CBC in one week 3. Follow up with Dr. Randie Glass in 1-2 weeks  Home Health: None Equipment/Devices: Walker  Discharge Condition: Stable CODE STATUS: Full Diet recommendation: Heart healthy, low carb.   HPI: per Dr. Antionette Glass, Marcus Glass is a 56 y.o. male with medical history significant for type 2 diabetes mellitus, paroxysmal atrial fibrillation, coronary artery disease, peripheral arterial disease, and chronic diastolic CHF who presents to the emergency department for evaluation of intractable bilateral lower extremity pain. Patient reports suffering from bilateral lower extremity pain for months, but notes that it has been progressively worsening and has become so severe that he has been unable to get out of bed for a few days. He reports development of a sore on his buttock because of this. Patient describes his symptoms as initially occurring with activity and resolving with rest, but has more recently been experiencing severe pain even at rest. Symptoms may be slightly better if he allows his legs to hang dependently. But has not been able to engage in any activity in a few days due to the resulting increase in his severe pain. Patient denies any recent fevers or chills, denies chest pain or palpitations, denies dyspnea or cough, and denies abdominal pain, nausea, vomiting, or diarrhea. He has not attempted any interventions for his symptoms prior to coming in.  Hospital Course: Discharge Diagnoses:  Principal Problem:   Intractable pain Active Problems:   Vascular claudication (HCC)   PAD (peripheral artery disease) (HCC)   Bed sore on buttock   Diabetes mellitus type II, non  insulin dependent (HCC)   CAD (coronary artery disease)   PAF (paroxysmal atrial fibrillation) (HCC)   Hyponatremia   Chronic diastolic CHF (congestive heart failure) (HCC)  Symptomatic peripheral vascular disease - patient hasn't had a hospital with severe bilateral lower extremity pain. Vascular surgery was consulted and evaluated patient hospitalized. He underwent an angiogram on 1/24 which revealed significant disease and aortobiliac segments diffusely diseased without flow-limiting stenosis. The left hypogastric artery is occluded and there is tight stenosis on the left side with greater than 80% of both common femoral and profunda femoris arteries. Patient was taken to the operating room on 1/26 and he underwent femoral endarterectomy with bovine pericardial patch angioplasty. He recovered well postop, his pain has decreased and he was able to ambulate in the hallway. He was discharged home in stable condition and will need outpatient follow-up with plastic surgery in 1-2 weeks Diabetes Mellitus type II - prior to hospitalization patient was on oral agents, his hemoglobin A1c was checked and it was elevated to 13. He was placed on Lantus and sliding scale with improvement in his CBGs, and was started on Lantus and NovoLog at home. Patient tells me he has supplies to check his CBGs, discussed extensively with patient and his wife, he will need to check his CBGs 4 times daily over the next at least 7 days and discuss findings with his PCP as I suspect he will need further insulin adjustment Chronic systolic CHF (EF 95-62% as of 02/04/8655)  - Continue oral lasix. Pre-op 2-D echo performed 01/30/16 and revealed minimal changes to echo in 2016. Coronary artery disease s/p CABG 2016 - Monitor, no chest pain.  Mild leukocytosis - improved Hypertension - Continue coreg, lasix Hyperlipidemia - Continue lipitor  Discharge Instructions  Allergies as of 02/03/2016      Reactions   No Known Allergies        Medication List    TAKE these medications   albuterol 108 (90 Base) MCG/ACT inhaler Commonly known as:  PROVENTIL HFA;VENTOLIN HFA Inhale 2 puffs into the lungs every 6 (six) hours as needed for wheezing or shortness of breath.   amiodarone 400 MG tablet Commonly known as:  PACERONE Take 200 mg by mouth daily.   amitriptyline 25 MG tablet Commonly known as:  ELAVIL Take 25 mg by mouth at bedtime as needed for sleep.   aspirin EC 81 MG tablet Take 81 mg by mouth daily.   atorvastatin 80 MG tablet Commonly known as:  LIPITOR Take 80 mg by mouth every morning.   carvedilol 6.25 MG tablet Commonly known as:  COREG Take 6.25 mg by mouth 2 (two) times daily with a meal.   clonazePAM 1 MG tablet Commonly known as:  KLONOPIN Take 1 mg by mouth 2 (two) times daily.   ELIQUIS 5 MG Tabs tablet Generic drug:  apixaban Take 5 mg by mouth 2 (two) times daily.   famotidine 20 MG tablet Commonly known as:  PEPCID Take 20 mg by mouth 2 (two) times daily.   Fish Oil 1200 MG Caps Take 1,200 mg by mouth 2 (two) times daily. Per medication list patient to take 1200 mg  2 (two) oral two times daily   furosemide 40 MG tablet Commonly known as:  LASIX Take 120 mg by mouth 2 (two) times daily.   insulin aspart 100 UNIT/ML injection Commonly known as:  novoLOG Inject 4 Units into the skin 3 (three) times daily with meals.   insulin glargine 100 UNIT/ML injection Commonly known as:  LANTUS Inject 0.2 mLs (20 Units total) into the skin daily.   lisinopril 5 MG tablet Commonly known as:  PRINIVIL,ZESTRIL Take 5 mg by mouth daily.   metFORMIN 1000 MG tablet Commonly known as:  GLUCOPHAGE Take 1,000 mg by mouth 2 (two) times daily with a meal.   nitroGLYCERIN 0.4 MG SL tablet Commonly known as:  NITROSTAT Place 0.4 mg under the tongue every 5 (five) minutes as needed for chest pain.   nystatin-triamcinolone cream Commonly known as:  MYCOLOG II Apply 1 application topically 2  (two) times daily as needed (rash).   oxyCODONE-acetaminophen 5-325 MG tablet Commonly known as:  PERCOCET/ROXICET Take 1-2 tablets by mouth every 4 (four) hours as needed for moderate pain.   potassium chloride 20 MEQ packet Commonly known as:  KLOR-CON Take 20 mEq by mouth daily.            Durable Medical Equipment        Start     Ordered   02/03/16 1041  For home use only DME Walker  Once    Question:  Patient needs a walker to treat with the following condition  Answer:  Weak   02/03/16 1041   02/03/16 0952  For home use only DME Walker rolling  Once    Comments:  Post op femoral endarterectomy  Question:  Patient needs a walker to treat with the following condition  Answer:  Weakness   02/03/16 4098     Follow-up Information    Lemar Livings, MD Follow up in 3 week(s).   Specialties:  Vascular Surgery, Cardiology Why:  office will arrange Contact information:  [address] [place] [address] [phone]        Marcus FusiSCHULTZ,DOUGLAS E, MD Follow up on 02/11/2016.   Specialty:  Internal Medicine Why:  AT 10AM 2/62019 Contact information: [address] [place] [address] [phone]        Inc[facility] - Dme [facility] Follow up.   Why:  rolling walker arranged- to be delivered to room prior to discharge Contact information: [address] [place] [address] [phone]          Allergies  Allergen Reactions  . No Known Allergies     Consultations:  Vascular surgery  Procedures/Studies:  2D echo (01/30/2016)  US guided cannulation of right common femoral artery (01/29/2016)  Left femoral endarterectomy (01/31/2016).  Ct Angio Aortobifemoral W And/or Wo Contrast  Result Date: 01/25/2016 CLINICAL DATA:  56 y/o M; 56 y/o M; chest pain and hip pain down to the toes with history of blood flow problems to the legs in the past. EXAM: CT ANGIOGRAPHY OF ABDOMINAL AORTA WITH ILIOFEMORAL RUNOFF TECHNIQUE: Multidetector CT  imaging of the abdomen, pelvis and lower extremities was performed using the standard protocol during bolus administration of intravenous contrast. Multiplanar CT image reconstructions and MIPs were obtained to evaluate the vascular anatomy. CONTRAST:  100 cc Isovue 370 COMPARISON:  None. FINDINGS: VASCULAR Aorta: Normal caliber aorta without aneurysm, dissection, vasculitis or significant stenosis. Severe aortic atherosclerosis with extensive shaggy fibrofatty plaque. Celiac: Calcified plaque of celiac artery origin with moderate 50% stenosis (sagittal image 110). SMA: Patent without evidence of aneurysm, dissection, vasculitis or significant stenosis. Renals: Both renal arteries are patent without evidence of aneurysm, dissection, vasculitis, fibromuscular dysplasia or significant stenosis. There is an accessory left renal artery with severe stenosis of the origin due to fibrofatty plaque (series 501, image 84). IMA: Patent with severe stenosis of the origin due to fibrofatty plaque (series 507, image 112). RIGHT Lower Extremity Inflow: Common, internal and external iliac arteries are patent without evidence of aneurysm, dissection, or vasculitis. There is severe calcific and fibrofatty plaque throughout the common, internal, and external iliac artery's. Short segment of mild-to-moderate stenosis of right proximal common iliac artery (series 501, image 117). Mild stenosis of proximal right external iliac artery. Severe thread-like patency of proximal bilateral internal iliac arteries. Outflow: Common, superficial and profunda femoral arteries and the popliteal artery are patent without evidence of aneurysm, dissection, or vasculitis. Severe stenosis of right superficial femoral artery origin at the bifurcation (series 501, image 211) and multiple segments of mild-to-moderate stenosis throughout its course. Moderate stenosis of the profunda femoral origin (series 507, image 61). Mild bilateral popliteal artery  stenosis. Runoff: Patent three vessel runoff to the ankle. LEFT Lower Extremity Inflow: Common, internal and external iliac arteries are patent without evidence of aneurysm, dissection, or vasculitis. Mild diffuse stenosis of left common iliac artery. Long segment of moderate stenosis of left proximal external iliac artery. Severe stenosis thread-like patency of proximal bilateral internal iliac arteries. Outflow: Common, superficial and profunda femoral arteries and the popliteal artery are patent without evidence of aneurysm, dissection, or vasculitis. Severe stenosis/near occlusion of left superficial femoral artery origin (series 501, image 212), multiple segments of mild-to-moderate stenosis throughout the vessel course and single short segment of severe stenosis within the mid thigh (series 501 image 282 and series 507, image 133). Severe stenosis of the left profunda femoral origin (series 507, image 141). Mild bilateral popliteal artery stenosis. Runoff: Patent three vessel runoff to the ankle. Veins: No  obvious venous abnormality within the limitations of this arterial phase study. Review of the MIP images confirms the above findings. NON-VASCULAR Lower chest: No acute abnormality. Hepatobiliary: No focal liver abnormality is seen. No gallstones, gallbladder wall thickening, or biliary dilatation. Pancreas: Unremarkable. No pancreatic ductal dilatation or surrounding inflammatory changes. Spleen: Normal in size without focal abnormality. Adrenals/Urinary Tract: Adrenal glands are unremarkable. Kidneys are normal, without renal calculi, focal lesion, or hydronephrosis. Bladder is unremarkable. Stomach/Bowel: Stomach is within normal limits. Appendix appears normal. No evidence of bowel wall thickening, distention, or inflammatory changes. Lymphatic: No lymphadenopathy. Reproductive: Prostate is unremarkable. Other: No abdominal wall hernia or abnormality. No abdominopelvic ascites. Musculoskeletal: Mild  multilevel spinal spondylosis with lower lumbar facet arthropathy. Mild osteoarthrosis of the hip joints bilaterally with acetabular productive changes. IMPRESSION: VASCULAR 1. Severe aortic atherosclerosis with extensive shaggy fibrofatty plaque. No high-grade stenosis or aneurysm. 2. Patent three-vessel runoff bilaterally to the ankles. 3. Multiple levels of mild-to-moderate stenosis throughout the iliac, femoral, and popliteal arteries. 4. Severe stenosis of bilateral superficial femoral artery origins greater on the left where there is near occlusion. Additional short segment of severe stenosis of left superficial femoral artery in the mid thigh. 5. Moderate stenosis of right profunda femoral origin and severe stenosis of left profunda femoral origin. 6. Severe stenosis/near occlusion of proximal common iliac artery origins bilaterally. 7. Moderate stenosis of celiac artery origin. 8. Severe stenosis of left accessory renal artery origin. 9. Severe stenosis of IMA origin. NON-VASCULAR No acute process of abdomen or pelvis identified. Electronically Signed   By: Mitzi Hansen M.D.   On: 01/25/2016 23:17   Dg Chest Port 1 View  Result Date: 01/26/2016 CLINICAL DATA:  Lactic acidosis, no chest pain or shortness of breath, history diabetes EXAM: PORTABLE CHEST 1 VIEW COMPARISON:  07/20/2014 FINDINGS: There is no focal parenchymal opacity. There is no pleural effusion or pneumothorax. The heart and mediastinal contours are unremarkable. There is evidence of prior CABG. The osseous structures are unremarkable. IMPRESSION: No active disease. Electronically Signed   By: Elige Ko   On: 01/26/2016 09:48      Subjective: - no chest pain, shortness of breath, no abdominal pain, nausea or vomiting.   Discharge Exam: Vitals:   02/02/16 2122 02/03/16 0600  BP: (!) 117/58 (!) 103/55  Pulse: (!) 59 (!) 55  Resp: 18 18  Temp: 98.7 F (37.1 C) 97.7 F (36.5 C)   Vitals:   02/02/16 0527  02/02/16 1330 02/02/16 2122 02/03/16 0600  BP: (!) 102/55 130/70 (!) 117/58 (!) 103/55  Pulse: 66 66 (!) 59 (!) 55  Resp: 18 18 18 18   Temp: 97.7 F (36.5 C) 97.9 F (36.6 C) 98.7 F (37.1 C) 97.7 F (36.5 C)  TempSrc: Oral Oral Oral Oral  SpO2: 97% 98% 98% 96%  Weight:      Height:        General: Patient is alert, awake, not in acute distress Cardiovascular: RRR, S1/S2 +, no rubs, no gallops Respiratory: CTA bilaterally, no wheezing, no rhonchi Abdominal: Soft, NT, ND, bowel sounds + Extremities: 2+ pitting edema.   The results of significant diagnostics from this hospitalization (including imaging, microbiology, ancillary and laboratory) are listed below for reference.    Microbiology: Recent Results (from the past 240 hour(s))  MRSA PCR Screening     Status: None   Collection Time: 01/29/16  8:00 AM  Result Value Ref Range Status   MRSA by PCR NEGATIVE NEGATIVE Final    Comment:  The GeneXpert MRSA Assay (FDA approved for NASAL specimens only), is one component of a comprehensive MRSA colonization surveillance program. It is not intended to diagnose MRSA infection nor to guide or monitor treatment for MRSA infections.      Labs: BNP (last 3 results) No results for input(s): BNP in the last 8760 hours. Basic Metabolic Panel:  Recent Labs Lab 01/29/16 0314 01/30/16 0344 01/31/16 0338 02/01/16 0310 02/02/16 0322  NA 134* 133* 131* 134* 133*  K 3.9 3.7 4.7 4.2 4.0  CL 98* 99* 91* 99* 101  CO2 28 25 25 28 22   GLUCOSE 307* 293* 331* 175* 216*  BUN 9 5* 7 5* 6  CREATININE 1.06 0.88 1.10 1.03 0.92  CALCIUM 8.5* 8.3* 9.4 8.4* 8.2*   Liver Function Tests: No results for input(s): AST, ALT, ALKPHOS, BILITOT, PROT, ALBUMIN in the last 168 hours. No results for input(s): LIPASE, AMYLASE in the last 168 hours. No results for input(s): AMMONIA in the last 168 hours. CBC:  Recent Labs Lab 01/29/16 0314 01/30/16 0344 01/31/16 0338 02/01/16 0310  02/02/16 0322  WBC 9.8 12.5* 17.1* 14.1* 12.5*  HGB 15.3 15.1 17.2* 14.7 12.9*  HCT 43.2 43.9 48.7 43.7 37.6*  MCV 90.6 91.1 90.5 93.4 93.3  PLT 172 186 217 183 177   Cardiac Enzymes: No results for input(s): CKTOTAL, CKMB, CKMBINDEX, TROPONINI in the last 168 hours. BNP: Invalid input(s): POCBNP CBG:  Recent Labs Lab 02/02/16 0605 02/02/16 1126 02/02/16 1623 02/02/16 2109 02/03/16 0555  GLUCAP 192* 222* 162* 161* 152*   D-Dimer No results for input(s): DDIMER in the last 72 hours. Hgb A1c No results for input(s): HGBA1C in the last 72 hours. Lipid Profile No results for input(s): CHOL, HDL, LDLCALC, TRIG, CHOLHDL, LDLDIRECT in the last 72 hours. Thyroid function studies No results for input(s): TSH, T4TOTAL, T3FREE, THYROIDAB in the last 72 hours.  Invalid input(s): FREET3 Anemia work up No results for input(s): VITAMINB12, FOLATE, FERRITIN, TIBC, IRON, RETICCTPCT in the last 72 hours. Urinalysis    Component Value Date/Time   COLORURINE YELLOW 01/26/2016 0715   APPEARANCEUR CLEAR 01/26/2016 0715   LABSPEC 1.042 (H) 01/26/2016 0715   PHURINE 6.0 01/26/2016 0715   GLUCOSEU >=500 (A) 01/26/2016 0715   HGBUR NEGATIVE 01/26/2016 0715   BILIRUBINUR NEGATIVE 01/26/2016 0715   KETONESUR NEGATIVE 01/26/2016 0715   PROTEINUR NEGATIVE 01/26/2016 0715   NITRITE NEGATIVE 01/26/2016 0715   LEUKOCYTESUR NEGATIVE 01/26/2016 0715   Sepsis Labs Invalid input(s): PROCALCITONIN,  WBC,  LACTICIDVEN Microbiology Recent Results (from the past 240 hour(s))  MRSA PCR Screening     Status: None   Collection Time: 01/29/16  8:00 AM  Result Value Ref Range Status   MRSA by PCR NEGATIVE NEGATIVE Final    Comment:        The GeneXpert MRSA Assay (FDA approved for NASAL specimens only), is one component of a comprehensive MRSA colonization surveillance program. It is not intended to diagnose MRSA infection nor to guide or monitor treatment for MRSA infections.    Time  coordinating discharge: Over 30 minutes  SIGNED:  Scheryl Sanborn M. Elvera Lennox, MD [facility] [phone]   If 7PM-7AM, please contact night-coverage [email or web address] Password [email or web address]

## 2016-02-03 NOTE — Care Management Note (Signed)
Case Management Note Donn PieriniKristi Alexandru Moorer RN, BSN Unit 2W-Case Manager [phone]   Patient Details  Name: Marcus RoseWilliam Glass MRN: [id number] Date of Birth: 03/24/1960  Subjective/Objective:    Pt admitted with  LE pain, s/p femoral endarterectomy               Action/Plan: PTA pt lived at home- plan to return home- per PT -[facility]- pt does need a RW for home- order placed- [facility]- RW to be delivered to room prior to discharge.   Expected Discharge Date:  02/03/16               Expected Discharge Plan:  Home/Self Care  In-House Referral:     Discharge planning Services  CM Consult  Post Acute Care Choice:  Durable Medical Equipment Choice offered to:  Patient  DME Arranged:  Dan HumphreysWalker rolling DME Agency:  [facility]  [facility]:     Status of Service:  Completed, signed off  If discussed at Long Length of Stay Meetings, dates discussed:    Additional Comments:  Darrold SpanWebster, Mystery Schrupp [place], RN 02/03/2016, 9:54 AM

## 2016-02-03 NOTE — Progress Notes (Addendum)
[facility] [place]  Subjective  - Doing well ambulating and pain better controlled.   Objective (!) 103/55 (!) 55 97.7 F (36.5 C) (Oral) 18 96%  Intake/Output Summary (Last 24 hours) at 02/03/16 0743 Last data filed at 02/02/16 1700  Gross per 24 hour  Intake              720 ml  Output                0 ml  Net              720 ml    Left groin healing well Doppler DP Doppler signal Heart RRR Lungs non labored breathing   Assessment/Planning: POD # 3 left common femoral endarterectomy with bovine pericardial patch angioplasty  Ok to discharge home F/U in 3 weeks with repeat ABI's  Clinton GallantCOLLINS, EMMA [facility] 02/03/2016 7:43 AM --  Laboratory Lab Results:  Recent Labs  02/01/16 0310 02/02/16 0322  WBC 14.1* 12.5*  HGB 14.7 12.9*  HCT 43.7 37.6*  PLT 183 177   BMET  Recent Labs  02/01/16 0310 02/02/16 0322  NA 134* 133*  K 4.2 4.0  CL 99* 101  CO2 28 22  GLUCOSE 175* 216*  BUN 5* 6  CREATININE 1.03 0.92  CALCIUM 8.4* 8.2*    COAG Lab Results  Component Value Date   INR 0.97 01/31/2016   INR 1.08 01/29/2016   INR 1.05 01/25/2016   No results found for: PTT   I have independently interviewed patient and agree with PA assessment and plan above.   Adia Crammer C. [name], MD [facility] [place] Office: [phone] Pager: [phone]

## 2016-02-14 ENCOUNTER — Inpatient Hospital Stay (HOSPITAL_COMMUNITY)
Admission: EM | Admit: 2016-02-14 | Discharge: 2016-02-17 | DRG: 920 | Disposition: A | Discharge status: Home Health | Payer: Medicaid Other | Attending: Vascular Surgery | Admitting: Vascular Surgery

## 2016-02-14 ENCOUNTER — Encounter (HOSPITAL_COMMUNITY): Payer: Self-pay | Admitting: Emergency Medicine

## 2016-02-14 DIAGNOSIS — I2510 Atherosclerotic heart disease of native coronary artery without angina pectoris: Secondary | ICD-10-CM | POA: Diagnosis present

## 2016-02-14 DIAGNOSIS — I872 Venous insufficiency (chronic) (peripheral): Secondary | ICD-10-CM | POA: Diagnosis present

## 2016-02-14 DIAGNOSIS — I5032 Chronic diastolic (congestive) heart failure: Secondary | ICD-10-CM | POA: Diagnosis present

## 2016-02-14 DIAGNOSIS — Z7982 Long term (current) use of aspirin: Secondary | ICD-10-CM | POA: Diagnosis not present

## 2016-02-14 DIAGNOSIS — Z87891 Personal history of nicotine dependence: Secondary | ICD-10-CM

## 2016-02-14 DIAGNOSIS — Z7901 Long term (current) use of anticoagulants: Secondary | ICD-10-CM

## 2016-02-14 DIAGNOSIS — Z6838 Body mass index (BMI) 38.0-38.9, adult: Secondary | ICD-10-CM | POA: Diagnosis not present

## 2016-02-14 DIAGNOSIS — I480 Paroxysmal atrial fibrillation: Secondary | ICD-10-CM | POA: Diagnosis present

## 2016-02-14 DIAGNOSIS — Z8701 Personal history of pneumonia (recurrent): Secondary | ICD-10-CM | POA: Diagnosis not present

## 2016-02-14 DIAGNOSIS — Z833 Family history of diabetes mellitus: Secondary | ICD-10-CM | POA: Diagnosis not present

## 2016-02-14 DIAGNOSIS — I252 Old myocardial infarction: Secondary | ICD-10-CM | POA: Diagnosis not present

## 2016-02-14 DIAGNOSIS — Y838 Other surgical procedures as the cause of abnormal reaction of the patient, or of later complication, without mention of misadventure at the time of the procedure: Secondary | ICD-10-CM | POA: Diagnosis present

## 2016-02-14 DIAGNOSIS — E7800 Pure hypercholesterolemia, unspecified: Secondary | ICD-10-CM | POA: Diagnosis present

## 2016-02-14 DIAGNOSIS — Z8249 Family history of ischemic heart disease and other diseases of the circulatory system: Secondary | ICD-10-CM | POA: Diagnosis not present

## 2016-02-14 DIAGNOSIS — I110 Hypertensive heart disease with heart failure: Secondary | ICD-10-CM | POA: Diagnosis present

## 2016-02-14 DIAGNOSIS — G4730 Sleep apnea, unspecified: Secondary | ICD-10-CM | POA: Diagnosis present

## 2016-02-14 DIAGNOSIS — R1030 Lower abdominal pain, unspecified: Secondary | ICD-10-CM | POA: Diagnosis present

## 2016-02-14 DIAGNOSIS — Z87442 Personal history of urinary calculi: Secondary | ICD-10-CM

## 2016-02-14 DIAGNOSIS — Z951 Presence of aortocoronary bypass graft: Secondary | ICD-10-CM | POA: Diagnosis not present

## 2016-02-14 DIAGNOSIS — Z955 Presence of coronary angioplasty implant and graft: Secondary | ICD-10-CM

## 2016-02-14 DIAGNOSIS — M7989 Other specified soft tissue disorders: Secondary | ICD-10-CM | POA: Diagnosis present

## 2016-02-14 DIAGNOSIS — F419 Anxiety disorder, unspecified: Secondary | ICD-10-CM | POA: Diagnosis present

## 2016-02-14 DIAGNOSIS — T8131XA Disruption of external operation (surgical) wound, not elsewhere classified, initial encounter: Principal | ICD-10-CM | POA: Diagnosis present

## 2016-02-14 DIAGNOSIS — Z794 Long term (current) use of insulin: Secondary | ICD-10-CM | POA: Diagnosis not present

## 2016-02-14 DIAGNOSIS — E119 Type 2 diabetes mellitus without complications: Secondary | ICD-10-CM | POA: Diagnosis present

## 2016-02-14 DIAGNOSIS — S31104A Unspecified open wound of abdominal wall, left lower quadrant without penetration into peritoneal cavity, initial encounter: Secondary | ICD-10-CM | POA: Diagnosis present

## 2016-02-14 DIAGNOSIS — I9789 Other postprocedural complications and disorders of the circulatory system, not elsewhere classified: Secondary | ICD-10-CM | POA: Diagnosis not present

## 2016-02-14 DIAGNOSIS — Z79899 Other long term (current) drug therapy: Secondary | ICD-10-CM

## 2016-02-14 HISTORY — DX: Sleep apnea, unspecified: G47.30

## 2016-02-14 HISTORY — DX: Type 2 diabetes mellitus without complications: E11.9

## 2016-02-14 HISTORY — DX: Personal history of urinary calculi: Z87.442

## 2016-02-14 HISTORY — DX: Pure hypercholesterolemia, unspecified: E78.00

## 2016-02-14 HISTORY — DX: Pneumonia, unspecified organism: J18.9

## 2016-02-14 LAB — COMPREHENSIVE METABOLIC PANEL
ALBUMIN: 3.8 g/dL (ref 3.5–5.0)
ALK PHOS: 97 U/L (ref 38–126)
ALT: 24 U/L (ref 17–63)
AST: 21 U/L (ref 15–41)
Anion gap: 14 (ref 5–15)
BILIRUBIN TOTAL: 1 mg/dL (ref 0.3–1.2)
BUN: 13 mg/dL (ref 6–20)
CALCIUM: 8.6 mg/dL — AB (ref 8.9–10.3)
CO2: 27 mmol/L (ref 22–32)
CREATININE: 0.98 mg/dL (ref 0.61–1.24)
Chloride: 92 mmol/L — ABNORMAL LOW (ref 101–111)
GFR calc Af Amer: >60 mL/min (ref >60)
GFR calc non Af Amer: >60 mL/min (ref >60)
GLUCOSE: 294 mg/dL — AB (ref 65–99)
Potassium: 3.4 mmol/L — ABNORMAL LOW (ref 3.5–5.1)
Sodium: 133 mmol/L — ABNORMAL LOW (ref 135–145)
TOTAL PROTEIN: 6.9 g/dL (ref 6.5–8.1)

## 2016-02-14 LAB — CBC WITH DIFFERENTIAL/PLATELET
Basophils Absolute: 0.1 10*3/uL (ref 0.0–0.1)
Basophils Relative: 1 %
EOS ABS: 0.5 10*3/uL (ref 0.0–0.7)
EOS PCT: 3 %
HCT: 43.2 % (ref 39.0–52.0)
HEMOGLOBIN: 15 g/dL (ref 13.0–17.0)
LYMPHS ABS: 3.1 10*3/uL (ref 0.7–4.0)
Lymphocytes Relative: 22 %
MCH: 31.6 pg (ref 26.0–34.0)
MCHC: 34.7 g/dL (ref 30.0–36.0)
MCV: 91.1 fL (ref 78.0–100.0)
MONOS PCT: 8 %
Monocytes Absolute: 1.2 10*3/uL — ABNORMAL HIGH (ref 0.1–1.0)
NEUTROS PCT: 66 %
Neutro Abs: 9.1 10*3/uL — ABNORMAL HIGH (ref 1.7–7.7)
Platelets: 289 10*3/uL (ref 150–400)
RBC: 4.74 MIL/uL (ref 4.22–5.81)
RDW: 13.2 % (ref 11.5–15.5)
WBC: 13.9 10*3/uL — ABNORMAL HIGH (ref 4.0–10.5)

## 2016-02-14 LAB — ABO/RH: ABO/RH(D): O POS

## 2016-02-14 LAB — TYPE AND SCREEN
ABO/RH(D): O POS
Antibody Screen: NEGATIVE

## 2016-02-14 LAB — PROTIME-INR
INR: 1.13
PROTHROMBIN TIME: 14.6 s (ref 11.4–15.2)

## 2016-02-14 LAB — APTT: aPTT: 32 seconds (ref 24–36)

## 2016-02-14 MED ORDER — ALUM & MAG HYDROXIDE-SIMETH 200-200-20 MG/5ML PO SUSP: 15.0000 mL | ORAL | Status: DC | PRN

## 2016-02-14 MED ORDER — ALBUTEROL SULFATE (2.5 MG/3ML) 0.083% IN NEBU: 3.0000 mL | INHALATION_SOLUTION | Freq: Four times a day (QID) | RESPIRATORY_TRACT | Status: DC | PRN

## 2016-02-14 MED ORDER — FAMOTIDINE 20 MG PO TABS
20.0000 mg | ORAL_TABLET | Freq: Two times a day (BID) | ORAL | Status: DC
  Administered 2016-02-14 – 2016-02-17 (×6): 20 mg via ORAL
  Filled 2016-02-14 (×6): qty 1

## 2016-02-14 MED ORDER — DOCUSATE SODIUM 100 MG PO CAPS
100.0000 mg | ORAL_CAPSULE | Freq: Two times a day (BID) | ORAL | Status: DC
  Administered 2016-02-14 – 2016-02-17 (×6): 100 mg via ORAL
  Filled 2016-02-14 (×6): qty 1

## 2016-02-14 MED ORDER — AMIODARONE HCL 200 MG PO TABS
200.0000 mg | ORAL_TABLET | Freq: Every day | ORAL | Status: DC
  Administered 2016-02-15 – 2016-02-17 (×3): 200 mg via ORAL
  Filled 2016-02-14 (×3): qty 1

## 2016-02-14 MED ORDER — MORPHINE SULFATE (PF) 2 MG/ML IV SOLN
2.0000 mg | INTRAVENOUS | Status: DC | PRN
  Administered 2016-02-15 (×5): 4 mg via INTRAVENOUS
  Administered 2016-02-15 (×2): 2 mg via INTRAVENOUS
  Administered 2016-02-16 – 2016-02-17 (×5): 4 mg via INTRAVENOUS
  Filled 2016-02-14 (×9): qty 2
  Filled 2016-02-14 (×2): qty 1
  Filled 2016-02-14 (×2): qty 2

## 2016-02-14 MED ORDER — LISINOPRIL 5 MG PO TABS
5.0000 mg | ORAL_TABLET | Freq: Every day | ORAL | Status: DC
  Administered 2016-02-16 – 2016-02-17 (×2): 5 mg via ORAL
  Filled 2016-02-14 (×3): qty 1

## 2016-02-14 MED ORDER — LABETALOL HCL 5 MG/ML IV SOLN: 10.0000 mg | INTRAVENOUS | Status: DC | PRN

## 2016-02-14 MED ORDER — POTASSIUM CHLORIDE IN NACL 20-0.9 MEQ/L-% IV SOLN
INTRAVENOUS | Status: DC
  Administered 2016-02-14 – 2016-02-16 (×2): via INTRAVENOUS
  Filled 2016-02-14 (×5): qty 1000

## 2016-02-14 MED ORDER — PANTOPRAZOLE SODIUM 40 MG PO TBEC
40.0000 mg | DELAYED_RELEASE_TABLET | Freq: Every day | ORAL | Status: DC
  Administered 2016-02-14 – 2016-02-17 (×4): 40 mg via ORAL
  Filled 2016-02-14 (×4): qty 1

## 2016-02-14 MED ORDER — ATORVASTATIN CALCIUM 80 MG PO TABS
80.0000 mg | ORAL_TABLET | ORAL | Status: DC
  Administered 2016-02-16 – 2016-02-17 (×2): 80 mg via ORAL
  Filled 2016-02-14 (×3): qty 1

## 2016-02-14 MED ORDER — POTASSIUM CHLORIDE CRYS ER 20 MEQ PO TBCR
20.0000 meq | EXTENDED_RELEASE_TABLET | Freq: Once | ORAL | Status: AC
  Administered 2016-02-14: 40 meq via ORAL
  Filled 2016-02-14: qty 2

## 2016-02-14 MED ORDER — GUAIFENESIN-DM 100-10 MG/5ML PO SYRP: 15.0000 mL | ORAL_SOLUTION | ORAL | Status: DC | PRN

## 2016-02-14 MED ORDER — METOPROLOL TARTRATE 5 MG/5ML IV SOLN: 2.0000 mg | INTRAVENOUS | Status: DC | PRN

## 2016-02-14 MED ORDER — HEPARIN SODIUM (PORCINE) 5000 UNIT/ML IJ SOLN
5000.0000 [IU] | Freq: Three times a day (TID) | INTRAMUSCULAR | Status: DC
  Filled 2016-02-14: qty 1

## 2016-02-14 MED ORDER — METFORMIN HCL 500 MG PO TABS
1000.0000 mg | ORAL_TABLET | Freq: Two times a day (BID) | ORAL | Status: DC
  Administered 2016-02-15 – 2016-02-17 (×5): 1000 mg via ORAL
  Filled 2016-02-14 (×6): qty 2

## 2016-02-14 MED ORDER — CLONAZEPAM 1 MG PO TABS
1.0000 mg | ORAL_TABLET | Freq: Two times a day (BID) | ORAL | Status: DC
  Administered 2016-02-14 – 2016-02-17 (×6): 1 mg via ORAL
  Filled 2016-02-14 (×6): qty 1

## 2016-02-14 MED ORDER — CARVEDILOL 6.25 MG PO TABS
6.2500 mg | ORAL_TABLET | Freq: Two times a day (BID) | ORAL | Status: DC
  Administered 2016-02-15 – 2016-02-17 (×5): 6.25 mg via ORAL
  Filled 2016-02-14 (×6): qty 1

## 2016-02-14 MED ORDER — PHENOL 1.4 % MT LIQD: 1.0000 | OROMUCOSAL | Status: DC | PRN

## 2016-02-14 MED ORDER — AMITRIPTYLINE HCL 50 MG PO TABS: 25.0000 mg | ORAL_TABLET | Freq: Every evening | ORAL | Status: DC | PRN

## 2016-02-14 MED ORDER — OXYCODONE HCL 5 MG PO TABS
5.0000 mg | ORAL_TABLET | ORAL | Status: DC | PRN
  Administered 2016-02-14: 5 mg via ORAL
  Administered 2016-02-15 – 2016-02-16 (×2): 10 mg via ORAL
  Filled 2016-02-14: qty 2
  Filled 2016-02-14: qty 1
  Filled 2016-02-14: qty 2

## 2016-02-14 MED ORDER — FUROSEMIDE 80 MG PO TABS
120.0000 mg | ORAL_TABLET | Freq: Two times a day (BID) | ORAL | Status: DC
  Administered 2016-02-14 – 2016-02-17 (×6): 120 mg via ORAL
  Filled 2016-02-14 (×7): qty 1

## 2016-02-14 MED ORDER — NITROGLYCERIN 0.4 MG SL SUBL: 0.4000 mg | SUBLINGUAL_TABLET | SUBLINGUAL | Status: DC | PRN

## 2016-02-14 MED ORDER — HYDRALAZINE HCL 20 MG/ML IJ SOLN: 5.0000 mg | INTRAMUSCULAR | Status: DC | PRN

## 2016-02-14 MED ORDER — ASPIRIN EC 81 MG PO TBEC
81.0000 mg | DELAYED_RELEASE_TABLET | Freq: Every day | ORAL | Status: DC
  Administered 2016-02-15 – 2016-02-17 (×3): 81 mg via ORAL
  Filled 2016-02-14 (×3): qty 1

## 2016-02-14 MED ORDER — ONDANSETRON HCL 4 MG/2ML IJ SOLN
4.0000 mg | Freq: Four times a day (QID) | INTRAMUSCULAR | Status: DC | PRN
  Administered 2016-02-15 – 2016-02-17 (×3): 4 mg via INTRAVENOUS
  Filled 2016-02-14 (×3): qty 2

## 2016-02-14 MED ORDER — OXYCODONE-ACETAMINOPHEN 5-325 MG PO TABS
1.0000 | ORAL_TABLET | ORAL | Status: DC | PRN
  Administered 2016-02-15 – 2016-02-17 (×2): 2 via ORAL
  Filled 2016-02-14 (×2): qty 2

## 2016-02-14 NOTE — H&P (Signed)
HP   History of Present Illness: This is a 56 y.o. male s/p left iliofem endartectomy for rest pain with depressed abi. Now returns with a few days of  draingage that appeared  Bloody today on the gauze. He otherwise has associated swelling of his left leg but rest pain has resolved. Continues to take eliquis. Denies fever or constitutional symptoms  Past Medical History:  Diagnosis Date  . Acute ST-segment elevation myocardial infarction (HCC) 07/2014  . Anxiety   . Bilateral leg edema   . CAD (coronary artery disease)   . CHF (congestive heart failure) (HCC)   . Chronic venous insufficiency   . Falling episodes   . High cholesterol   . History of kidney stones    "in my 20's and 30's"  . Hyperlipidemia   . Hypertension    hx (02/14/2016)  . Hypokalemia   . Left ventricular systolic dysfunction   . Paroxysmal atrial fibrillation (HCC)   . Pneumonia ~ 2013  . Sleep apnea    "tried it; didn't like it; need to get one/PCP" (02/14/2016)  . Sternum pain   . Type II diabetes mellitus (HCC)     Past Surgical History:  Procedure Laterality Date  . APPENDECTOMY    . CORONARY ANGIOPLASTY  04/2014   "reopened clotted stents"  . CORONARY ANGIOPLASTY WITH STENT PLACEMENT  01/2012  . CORONARY ARTERY BYPASS GRAFT  07/2014   "CABG X?4"  . ENDARTERECTOMY FEMORAL Left 01/31/2016   Procedure: LEFT FEMORAL ENDARTERECTOMY;  Surgeon: Maeola HarmanBrandon Christopher Cleopatra Sardo, MD;  [facility];  Service: Vascular;  Laterality: Left;  . HERNIA REPAIR    . LAPAROSCOPIC INCISIONAL / UMBILICAL / VENTRAL HERNIA REPAIR  1990s   UHR  . PERIPHERAL VASCULAR CATHETERIZATION N/A 01/29/2016   Procedure: Abdominal Aortogram w/Lower Extremity;  Surgeon: Maeola HarmanBrandon Christopher Domonik Levario, MD;  [facility];  Service: Cardiovascular;  Laterality: N/A;    Allergies  Allergen Reactions  . No Known Allergies     Prior to Admission medications   Medication Sig Start Date End Date Taking? Authorizing Provider    albuterol (PROVENTIL HFA;VENTOLIN HFA) 108 (90 Base) MCG/ACT inhaler Inhale 2 puffs into the lungs every 6 (six) hours as needed for wheezing or shortness of breath.    Yes Historical Provider, MD  amiodarone (PACERONE) 200 MG tablet Take 200 mg by mouth daily.    Yes Historical Provider, MD  amitriptyline (ELAVIL) 25 MG tablet Take 25 mg by mouth at bedtime as needed for sleep.    Yes Historical Provider, MD  apixaban (ELIQUIS) 5 MG TABS tablet Take 5 mg by mouth 2 (two) times daily.   Yes Historical Provider, MD  aspirin EC 81 MG tablet Take 81 mg by mouth daily.   Yes Historical Provider, MD  atorvastatin (LIPITOR) 80 MG tablet Take 80 mg by mouth every morning.    Yes Historical Provider, MD  carvedilol (COREG) 6.25 MG tablet Take 6.25 mg by mouth 2 (two) times daily with a meal.   Yes Historical Provider, MD  clonazePAM (KLONOPIN) 1 MG tablet Take 1 mg by mouth 2 (two) times daily.    Yes Historical Provider, MD  furosemide (LASIX) 40 MG tablet Take 120 mg by mouth 2 (two) times daily.    Yes Historical Provider, MD  insulin aspart (NOVOLOG) 100 UNIT/ML injection Inject 4 Units into the skin 3 (three) times daily with meals. 02/03/16  Yes Costin Otelia SergeantM Gherghe, MD  insulin glargine (LANTUS) 100 UNIT/ML injection Inject  0.2 mLs (20 Units total) into the skin daily. Patient taking differently: Inject 20 Units into the skin at bedtime.  02/03/16  Yes Costin Otelia Sergeant, MD  lisinopril (PRINIVIL,ZESTRIL) 5 MG tablet Take 5 mg by mouth daily.   Yes Historical Provider, MD  metFORMIN (GLUCOPHAGE) 1000 MG tablet Take 1,000 mg by mouth 2 (two) times daily with a meal.   Yes Historical Provider, MD  nitroGLYCERIN (NITROSTAT) 0.4 MG SL tablet Place 0.4 mg under the tongue every 5 (five) minutes as needed for chest pain.    Yes Historical Provider, MD  nystatin-triamcinolone (MYCOLOG II) cream Apply 1 application topically 2 (two) times daily as needed (rash).    Yes Historical Provider, MD   oxyCODONE-acetaminophen (PERCOCET/ROXICET) 5-325 MG tablet Take 1-2 tablets by mouth every 4 (four) hours as needed for moderate pain. 02/03/16  Yes Costin Otelia Sergeant, MD  potassium chloride (KLOR-CON) 20 MEQ packet Take 20 mEq by mouth daily.   Yes Historical Provider, MD  pregabalin (LYRICA) 50 MG capsule Take 50 mg by mouth 2 (two) times daily.   Yes Historical Provider, MD    Social History   Social History  . Marital status: Married    Spouse name: N/A  . Number of children: N/A  . Years of education: N/A   Occupational History  . Not on file.   Social History Main Topics  . Smoking status: Former Smoker    Packs/day: 0.50    Years: 32.00    Types: Cigarettes    Quit date: 09/05/2013  . Smokeless tobacco: Never Used  . Alcohol use Yes     Comment: 02/14/2016 "glass of wine maybe once/year"  . Drug use: No  . Sexual activity: No   Other Topics Concern  . Not on file   Social History Narrative  . No narrative on file     Family History  Problem Relation Age of Onset  . Heart disease Mother   . Hyperlipidemia Mother   . Hypertension Mother   . Cancer Father     lung and prostate   . Diabetes Father     ROS: [x]  Positive   [ ]  Negative   [ ]  All sytems reviewed and are negative  Cardiovascular: []  chest pain/pressure []  palpitations []  SOB lying flat []  DOE []  pain in legs while walking []  pain in legs at rest []  pain in legs at night []  non-healing ulcers []  hx of DVT [x]  swelling in legs  Pulmonary: []  productive cough []  asthma/wheezing []  home O2  Neurologic: []  weakness in []  arms []  legs []  numbness in []  arms []  legs []  hx of CVA []  mini stroke [] difficulty speaking or slurred speech []  temporary loss of vision in one eye []  dizziness  Hematologic: []  hx of cancer []  bleeding problems []  problems with blood clotting easily  Endocrine:   []  diabetes []  thyroid disease  GI []  vomiting blood []  blood in stool  GU: []  CKD/renal  failure []  HD--[]  M/W/F or []  T/T/S []  burning with urination []  blood in urine  Psychiatric: []  anxiety []  depression  Musculoskeletal: []  arthritis []  joint pain  Integumentary: []  rashes []  ulcers  Constitutional: []  fever []  chills   Physical Examination  Vitals:   02/14/16 1642 02/14/16 1700  BP: (!) 103/46 (!) 113/48  Pulse: 62 63  Resp: 16   Temp: 98.1 F (36.7 C) 97.8 F (36.6 C)   Body mass index is 38.77 kg/m.  General:  WDWN in  NAD Gait: slowed HENT: WNL, normocephalic Pulmonary: normal non-labored breathing Cardiac: irregular, palpable left popliteal pulse Abdomen:  soft, NT/ND, no masses Skin: breakdown of left groin with minimal drainage and fibrinous exudate in wound  Musculoskeletal: no muscle wasting or atrophy  Neurologic: A&O X 3; Appropriate Affect ; SENSATION: normal; MOTOR FUNCTION:  moving all extremities equally. Speech is fluent/normal   CBC    Component Value Date/Time   WBC 12.5 (H) 02/02/2016 0322   RBC 4.03 (L) 02/02/2016 0322   HGB 12.9 (L) 02/02/2016 0322   HCT 37.6 (L) 02/02/2016 0322   PLT 177 02/02/2016 0322   MCV 93.3 02/02/2016 0322   MCH 32.0 02/02/2016 0322   MCHC 34.3 02/02/2016 0322   RDW 13.7 02/02/2016 0322   LYMPHSABS 3.1 01/25/2016 1936   MONOABS 0.9 01/25/2016 1936   EOSABS 0.4 01/25/2016 1936   BASOSABS 0.1 01/25/2016 1936    BMET    Component Value Date/Time   NA 133 (L) 02/02/2016 0322   K 4.0 02/02/2016 0322   CL 101 02/02/2016 0322   CO2 22 02/02/2016 0322   GLUCOSE 216 (H) 02/02/2016 0322   BUN 6 02/02/2016 0322   CREATININE 0.92 02/02/2016 0322   CALCIUM 8.2 (L) 02/02/2016 0322   GFRNONAA >60 02/02/2016 0322   GFRAA >60 02/02/2016 0322    COAGS: Lab Results  Component Value Date   INR 0.97 01/31/2016   INR 1.08 01/29/2016   INR 1.05 01/25/2016       ASSESSMENT/PLAN: This is a 56 y.o. male  S/p left iliofemoral endarterectomy with groin wound dehiscence. Plan for OR tomorrow for  debridement.   Abbygail Willhoite C. Randie Heinz, MD [facility] [place] Office: [phone] Pager: [phone]

## 2016-02-14 NOTE — Anesthesia Preprocedure Evaluation (Signed)
Anesthesia Evaluation  Patient identified by MRN, date of birth, ID band Patient awake    Reviewed: Allergy & Precautions, NPO status , Patient's Chart, lab work & pertinent test results  Airway Mallampati: II  TM Distance: >3 FB Neck ROM: Full    Dental no notable dental hx.    Pulmonary neg pulmonary ROS, former smoker,    Pulmonary exam normal breath sounds clear to auscultation       Cardiovascular hypertension, + CAD, + Past MI, + CABG and + Peripheral Vascular Disease  Normal cardiovascular exam Rhythm:Regular Rate:Normal     Neuro/Psych negative neurological ROS  negative psych ROS   GI/Hepatic negative GI ROS, Neg liver ROS,   Endo/Other  diabetesMorbid obesity  Renal/GU negative Renal ROS  negative genitourinary   Musculoskeletal negative musculoskeletal ROS (+)   Abdominal   Peds negative pediatric ROS (+)  Hematology negative hematology ROS (+)   Anesthesia Other Findings   Reproductive/Obstetrics negative OB ROS                             Anesthesia Physical Anesthesia Plan  ASA: III  Anesthesia Plan: General   Post-op Pain Management:    Induction: Intravenous  Airway Management Planned: LMA  Additional Equipment:   Intra-op Plan:   Post-operative Plan: Extubation in OR  Informed Consent: I have reviewed the patients History and Physical, chart, labs and discussed the procedure including the risks, benefits and alternatives for the proposed anesthesia with the patient or authorized representative who has indicated his/her understanding and acceptance.   Dental advisory given  Plan Discussed with: CRNA and Surgeon  Anesthesia Plan Comments:         Anesthesia Quick Evaluation

## 2016-02-14 NOTE — Progress Notes (Signed)
Awaiting call back from NIKEMichael RN for report

## 2016-02-14 NOTE — ED Triage Notes (Signed)
Had an iliac  endarterectomy  Thru his left groin , now having some bleeding an oozing issues

## 2016-02-15 ENCOUNTER — Inpatient Hospital Stay (HOSPITAL_COMMUNITY): Payer: Medicaid Other | Admitting: Anesthesiology

## 2016-02-15 ENCOUNTER — Encounter (HOSPITAL_COMMUNITY)
Admission: EM | Disposition: A | Discharge status: Home Health | Payer: Self-pay | Source: Home / Self Care | Attending: Vascular Surgery

## 2016-02-15 DIAGNOSIS — I9789 Other postprocedural complications and disorders of the circulatory system, not elsewhere classified: Secondary | ICD-10-CM

## 2016-02-15 HISTORY — PX: I&D EXTREMITY: SHX5045

## 2016-02-15 HISTORY — PX: APPLICATION OF WOUND VAC: SHX5189

## 2016-02-15 LAB — GLUCOSE, CAPILLARY
GLUCOSE-CAPILLARY: 218 mg/dL — AB (ref 65–99)
GLUCOSE-CAPILLARY: 278 mg/dL — AB (ref 65–99)
GLUCOSE-CAPILLARY: 306 mg/dL — AB (ref 65–99)
Glucose-Capillary: 281 mg/dL — ABNORMAL HIGH (ref 65–99)
Glucose-Capillary: 210 mg/dL — ABNORMAL HIGH (ref 65–99)
Glucose-Capillary: 249 mg/dL — ABNORMAL HIGH (ref 65–99)

## 2016-02-15 LAB — APTT: aPTT: 38 seconds — ABNORMAL HIGH (ref 24–36)

## 2016-02-15 LAB — HEPARIN LEVEL (UNFRACTIONATED): Heparin Unfractionated: 0.35 IU/mL (ref 0.30–0.70)

## 2016-02-15 SURGERY — IRRIGATION AND DEBRIDEMENT EXTREMITY
Anesthesia: General | Site: Groin | Laterality: Left

## 2016-02-15 MED ORDER — HYDROMORPHONE HCL 1 MG/ML IJ SOLN: 0.2500 mg | INTRAMUSCULAR | Status: DC | PRN

## 2016-02-15 MED ORDER — CEFAZOLIN SODIUM-DEXTROSE 2-3 GM-% IV SOLR
INTRAVENOUS | Status: DC | PRN
  Administered 2016-02-15: 2 g via INTRAVENOUS

## 2016-02-15 MED ORDER — PROMETHAZINE HCL 25 MG/ML IJ SOLN: 6.2500 mg | INTRAMUSCULAR | Status: DC | PRN

## 2016-02-15 MED ORDER — MIDAZOLAM HCL 2 MG/2ML IJ SOLN
INTRAMUSCULAR | Status: DC | PRN
  Administered 2016-02-15: 2 mg via INTRAVENOUS

## 2016-02-15 MED ORDER — INSULIN ASPART 100 UNIT/ML ~~LOC~~ SOLN
0.0000 [IU] | SUBCUTANEOUS | Status: DC
  Administered 2016-02-15 (×4): 5 [IU] via SUBCUTANEOUS
  Administered 2016-02-15: 11 [IU] via SUBCUTANEOUS
  Administered 2016-02-16: 3 [IU] via SUBCUTANEOUS
  Administered 2016-02-16: 2 [IU] via SUBCUTANEOUS
  Administered 2016-02-16 (×2): 3 [IU] via SUBCUTANEOUS
  Administered 2016-02-16: 5 [IU] via SUBCUTANEOUS
  Administered 2016-02-17: 3 [IU] via SUBCUTANEOUS
  Administered 2016-02-17: 2 [IU] via SUBCUTANEOUS
  Administered 2016-02-17: 3 [IU] via SUBCUTANEOUS
  Administered 2016-02-17: 2 [IU] via SUBCUTANEOUS

## 2016-02-15 MED ORDER — CEFAZOLIN SODIUM-DEXTROSE 2-4 GM/100ML-% IV SOLN
2.0000 g | Freq: Three times a day (TID) | INTRAVENOUS | Status: DC
  Administered 2016-02-15 – 2016-02-17 (×7): 2 g via INTRAVENOUS
  Filled 2016-02-15 (×9): qty 100

## 2016-02-15 MED ORDER — HEPARIN (PORCINE) IN NACL 100-0.45 UNIT/ML-% IJ SOLN
1850.0000 [IU]/h | INTRAMUSCULAR | Status: DC
  Administered 2016-02-15: 1400 [IU]/h via INTRAVENOUS
  Administered 2016-02-16 – 2016-02-17 (×3): 1850 [IU]/h via INTRAVENOUS
  Filled 2016-02-15 (×4): qty 250

## 2016-02-15 MED ORDER — LIDOCAINE HCL (CARDIAC) 20 MG/ML IV SOLN
INTRAVENOUS | Status: DC | PRN
  Administered 2016-02-15: 8 mg via INTRAVENOUS

## 2016-02-15 MED ORDER — SODIUM CHLORIDE 0.9 % IR SOLN
Status: DC | PRN
  Administered 2016-02-15: 1000 mL

## 2016-02-15 MED ORDER — FENTANYL CITRATE (PF) 100 MCG/2ML IJ SOLN
INTRAMUSCULAR | Status: DC | PRN
  Administered 2016-02-15: 50 ug via INTRAVENOUS

## 2016-02-15 MED ORDER — MIDAZOLAM HCL 2 MG/2ML IJ SOLN
INTRAMUSCULAR | Status: AC
  Filled 2016-02-15: qty 2

## 2016-02-15 MED ORDER — LACTATED RINGERS IV SOLN
INTRAVENOUS | Status: DC | PRN
  Administered 2016-02-15: 07:00:00 via INTRAVENOUS

## 2016-02-15 MED ORDER — FENTANYL CITRATE (PF) 100 MCG/2ML IJ SOLN
INTRAMUSCULAR | Status: AC
  Filled 2016-02-15: qty 4

## 2016-02-15 MED ORDER — ONDANSETRON HCL 4 MG/2ML IJ SOLN
INTRAMUSCULAR | Status: DC | PRN
Start: 2016-02-15 — End: 2016-02-15
  Administered 2016-02-15: 4 mg via INTRAVENOUS

## 2016-02-15 MED ORDER — PROPOFOL 10 MG/ML IV BOLUS
INTRAVENOUS | Status: DC | PRN
  Administered 2016-02-15: 200 mg via INTRAVENOUS

## 2016-02-15 MED ORDER — CEFAZOLIN SODIUM 1 G IJ SOLR: 2.0000 g | Freq: Three times a day (TID) | INTRAMUSCULAR | Status: DC

## 2016-02-15 MED ORDER — INSULIN GLARGINE 100 UNIT/ML ~~LOC~~ SOLN
20.0000 [IU] | Freq: Every day | SUBCUTANEOUS | Status: DC
  Administered 2016-02-15 – 2016-02-17 (×3): 20 [IU] via SUBCUTANEOUS
  Filled 2016-02-15 (×3): qty 0.2

## 2016-02-15 MED ORDER — PROPOFOL 10 MG/ML IV BOLUS
INTRAVENOUS | Status: AC
  Filled 2016-02-15: qty 60

## 2016-02-15 SURGICAL SUPPLY — 33 items
BANDAGE ACE 4X5 VEL STRL LF (GAUZE/BANDAGES/DRESSINGS) IMPLANT
BANDAGE ACE 6X5 VEL STRL LF (GAUZE/BANDAGES/DRESSINGS) IMPLANT
BNDG GAUZE ELAST 4 BULKY (GAUZE/BANDAGES/DRESSINGS) IMPLANT
CANISTER SUCTION 2500CC (MISCELLANEOUS) ×4 IMPLANT
CANISTER WOUND CARE 500ML ATS (WOUND CARE) ×4 IMPLANT
COVER SURGICAL LIGHT HANDLE (MISCELLANEOUS) ×4 IMPLANT
DRAPE INCISE IOBAN 66X45 STRL (DRAPES) IMPLANT
DRAPE ORTHO SPLIT 77X108 STRL (DRAPES) ×4
DRAPE PROXIMA HALF (DRAPES) ×4 IMPLANT
DRAPE SURG ORHT 6 SPLT 77X108 (DRAPES) ×4 IMPLANT
DRSG ADAPTIC 3X8 NADH LF (GAUZE/BANDAGES/DRESSINGS) IMPLANT
DRSG VAC ATS SM SENSATRAC (GAUZE/BANDAGES/DRESSINGS) ×4 IMPLANT
ELECT REM PT RETURN 9FT ADLT (ELECTROSURGICAL) ×4
ELECTRODE REM PT RTRN 9FT ADLT (ELECTROSURGICAL) ×2 IMPLANT
GLOVE BIO SURGEON STRL SZ7.5 (GLOVE) ×4 IMPLANT
GOWN STRL REUS W/ TWL LRG LVL3 (GOWN DISPOSABLE) ×4 IMPLANT
GOWN STRL REUS W/ TWL XL LVL3 (GOWN DISPOSABLE) ×2 IMPLANT
GOWN STRL REUS W/TWL LRG LVL3 (GOWN DISPOSABLE) ×4
GOWN STRL REUS W/TWL XL LVL3 (GOWN DISPOSABLE) ×2
KIT BASIN OR (CUSTOM PROCEDURE TRAY) ×4 IMPLANT
KIT ROOM TURNOVER OR (KITS) ×4 IMPLANT
NS IRRIG 1000ML POUR BTL (IV SOLUTION) ×4 IMPLANT
PACK GENERAL/GYN (CUSTOM PROCEDURE TRAY) ×4 IMPLANT
PAD ARMBOARD 7.5X6 YLW CONV (MISCELLANEOUS) ×8 IMPLANT
SPONGE GAUZE 4X4 12PLY STER LF (GAUZE/BANDAGES/DRESSINGS) IMPLANT
SUT ETHILON 3 0 PS 1 (SUTURE) IMPLANT
SUT VIC AB 2-0 CTB1 (SUTURE) IMPLANT
SUT VIC AB 3-0 SH 27 (SUTURE)
SUT VIC AB 3-0 SH 27X BRD (SUTURE) IMPLANT
SUT VICRYL 4-0 PS2 18IN ABS (SUTURE) IMPLANT
TOWEL OR 17X24 6PK STRL BLUE (TOWEL DISPOSABLE) ×4 IMPLANT
TOWEL OR 17X26 10 PK STRL BLUE (TOWEL DISPOSABLE) ×4 IMPLANT
WATER STERILE IRR 1000ML POUR (IV SOLUTION) ×4 IMPLANT

## 2016-02-15 NOTE — Anesthesia Procedure Notes (Signed)
Procedure Name: LMA Insertion Date/Time: 02/15/2016 7:45 AM Performed by: Daiva EvesAVENEL, Maizee Reinhold W Pre-anesthesia Checklist: Patient identified, Emergency Drugs available, Suction available, Patient being monitored and Timeout performed Patient Re-evaluated:Patient Re-evaluated prior to inductionOxygen Delivery Method: Circle system utilized Preoxygenation: Pre-oxygenation with 100% oxygen Intubation Type: IV induction Ventilation: Mask ventilation without difficulty LMA: LMA inserted LMA Size: 5.0 Number of attempts: 1 Placement Confirmation: positive ETCO2 and breath sounds checked- equal and bilateral Tube secured with: Tape Dental Injury: Teeth and Oropharynx as per pre-operative assessment

## 2016-02-15 NOTE — Anesthesia Postprocedure Evaluation (Addendum)
Anesthesia Post Note  Patient: [place] Glass  Procedure(s) Performed: Procedure(s) (LRB): Left GROIN DEBRIDEMENT (Left) APPLICATION OF WOUND VAC LEFT GROIN (Left)  Patient location during evaluation: PACU Anesthesia Type: General Level of consciousness: awake and alert Pain management: pain level controlled Vital Signs Assessment: post-procedure vital signs reviewed and stable Respiratory status: spontaneous breathing, nonlabored ventilation, respiratory function stable and patient connected to nasal cannula oxygen Cardiovascular status: blood pressure returned to baseline and stable Postop Assessment: no signs of nausea or vomiting Anesthetic complications: no       Last Vitals:  Vitals:   02/15/16 0448 02/15/16 0819  BP: (!) 102/57 122/78  Pulse: 66 67  Resp: 20 15  Temp: 36.6 C 36.3 C    Last Pain:  Vitals:   02/15/16 0448  TempSrc: Oral  PainSc:                  Crisanto Nied S

## 2016-02-15 NOTE — Op Note (Signed)
    Patient name: Marcus Glass MRN: [id number] DOB: 11/20/1960 Sex: male  02/15/2016 Pre-operative Diagnosis: fat necrosis of left groin Post-operative diagnosis:  Same Surgeon:  Apolinar JunesBrandon C. [name], MD Assistant: OR nurse Procedure Performed: exploration of left groin with placement of wound vac 5 x 3 x 3cm  Indications:  56 year old male with a history of a left iliofemoral endarterectomy for left lower extremity rest pain. He now has breakdown of the superior and inferior aspects of his incision and is indicated for exploration.  Findings: There was fat necrosis just below the surface of the incision which was opened in its entirety. There was still tissue coverage of the artery which could be palpated in the deepest part of the wound. Wound was irrigated   Procedure:  The patient was identified in the holding area and taken to the operating room where he was placed supine on the operating table and general anesthesia was induced. He sterilely prepped and draped the left upper extremity given antibiotics and timeout called. I began by opening up his incision in its entirety of the left groin to total 5 cm. We encountered serosanguineous fluid as well as some minimal fat necrosis which was removed with scissors. Excess suture was also removed. I could palpate the femoral pulse at the base of the wound but there was tissue coverage. I irrigated copiously with 2 L of warm saline. Then trimmed a wound VAC to size and placed this to 125 mmHg suction at the groin. Patient tolerated procedure well without immediate complication.    Jeriah Skufca C. [name], MD [facility] [place] Office: [phone] Pager: [phone]

## 2016-02-15 NOTE — Progress Notes (Signed)
Gave pt tap water enema as ordered. Pt tolerated it well. [facility] Will continue to monitor pt. Nelda MarseilleJenny Thacker, RN

## 2016-02-15 NOTE — Progress Notes (Signed)
ANTICOAGULATION CONSULT NOTE - Initial Consult  Pharmacy Consult for Heparin Indication: atrial fibrillation  Assessment: [age] yoM admitted 2/9 for groin wound dehiscence s/p L iliofemoral endarterectomy. Patient is on Eliquis at home for PAF. Since patient is planned to go to the OR today for debridement, pharmacy has been consulted for heparin dosing. Heparin is to start at 1200 today. Last dose of Eliquis was 2/9 AM, baseline aPTT 32. Will not bolus heparin given recent Eliquis dose and OR plans. CBC wnl.   Goal of Therapy:  Heparin level 0.3-0.7 units/ml aPTT 66-102 seconds Monitor platelets by anticoagulation protocol: Yes   Plan:  Start heparin infusion at 1400 units/hr starting at 1200 Check aPTT and heparin level in 6 hours  Monitor daily aPTT/HL and CBC Follow-up OR plans   Allergies  Allergen Reactions  . No Known Allergies     Patient Measurements: Height: 5\' 8"  (172.7 cm) Weight: 255 lb (115.7 kg) IBW/kg (Calculated) : 68.4 Heparin Dosing Weight: 94.6 kg  Vital Signs: Temp: 97.3 F (36.3 C) (02/10 0857) Temp Source: Oral (02/10 0448) BP: 102/60 (02/10 0936) Pulse Rate: 64 (02/10 0936)  Labs:  Recent Labs  02/14/16 1909 02/14/16 1914  HGB 15.0  --   HCT 43.2  --   PLT 289  --   APTT 32  --   LABPROT 14.6  --   INR 1.13  --   CREATININE  --  0.98    Estimated Creatinine Clearance: 103.9 mL/min (by C-G formula based on SCr of 0.98 mg/dL).   Medical History: Past Medical History:  Diagnosis Date  . Acute ST-segment elevation myocardial infarction (HCC) 07/2014  . Anxiety   . Bilateral leg edema   . CAD (coronary artery disease)   . CHF (congestive heart failure) (HCC)   . Chronic venous insufficiency   . Falling episodes   . High cholesterol   . History of kidney stones    "in my 20's and 30's"  . Hyperlipidemia   . Hypertension    hx (02/14/2016)  . Hypokalemia   . Left ventricular systolic dysfunction   . Paroxysmal atrial fibrillation  (HCC)   . Pneumonia ~ 2013  . Sleep apnea    "tried it; didn't like it; need to get one/PCP" (02/14/2016)  . Sternum pain   . Type II diabetes mellitus (HCC)     Medications:  Infusions:  . 0.9 % NaCl with KCl 20 mEq / L Stopped (02/15/16 0640)  . heparin       Allie BossierApryl Nakeda Lebron, PharmD PGY1 Pharmacy Resident [phone] (Pager) 02/15/2016 9:48 AM

## 2016-02-15 NOTE — Progress Notes (Signed)
  Progress Note    02/15/2016 7:25 AM Day of Surgery  Subjective:  No acute issues  Vitals:   02/14/16 2325 02/15/16 0448  BP: 124/62 (!) 102/57  Pulse: 67 66  Resp: 20 20  Temp: 98.9 F (37.2 C) 97.9 F (36.6 C)    Physical Exam: aaox3 Non labored respirations Abdomen is soft Left leg with minimal edema Left groin dressing with drainage on it  CBC    Component Value Date/Time   WBC 13.9 (H) 02/14/2016 1909   RBC 4.74 02/14/2016 1909   HGB 15.0 02/14/2016 1909   HCT 43.2 02/14/2016 1909   PLT 289 02/14/2016 1909   MCV 91.1 02/14/2016 1909   MCH 31.6 02/14/2016 1909   MCHC 34.7 02/14/2016 1909   RDW 13.2 02/14/2016 1909   LYMPHSABS 3.1 02/14/2016 1909   MONOABS 1.2 (H) 02/14/2016 1909   EOSABS 0.5 02/14/2016 1909   BASOSABS 0.1 02/14/2016 1909    BMET    Component Value Date/Time   NA 133 (L) 02/14/2016 1914   K 3.4 (L) 02/14/2016 1914   CL 92 (L) 02/14/2016 1914   CO2 27 02/14/2016 1914   GLUCOSE 294 (H) 02/14/2016 1914   BUN 13 02/14/2016 1914   CREATININE 0.98 02/14/2016 1914   CALCIUM 8.6 (L) 02/14/2016 1914   GFRNONAA >60 02/14/2016 1914   GFRAA >60 02/14/2016 1914    INR    Component Value Date/Time   INR 1.13 02/14/2016 1909     Intake/Output Summary (Last 24 hours) at 02/15/16 0725 Last data filed at 02/15/16 0420  Gross per 24 hour  Intake           491.67 ml  Output                0 ml  Net           491.67 ml     Assessment:  56 y.o. male is s/p left fem endarterectomy now with groin wound breakdown, last took eliquis yesterday  Plan: OR today for operative debridement of left groin and possible vac placement. If extensive debridement needed with tissue coverage will defer until Monday to allow eliquis to washout. Discussed with patient and wife and they agree to proceed.   Milan Perkins C. [name], MD [facility] [place] Office: [phone] Pager: [phone]  02/15/2016 7:25 AM

## 2016-02-15 NOTE — Transfer of Care (Signed)
Immediate Anesthesia Transfer of Care Note  Patient: Marcus Glass  Procedure(s) Performed: Procedure(s): Left GROIN DEBRIDEMENT (Left) APPLICATION OF WOUND VAC LEFT GROIN (Left)  Patient Location: PACU  Anesthesia Type:General  Level of Consciousness: awake, alert  and oriented  Airway & Oxygen Therapy: Patient Spontanous Breathing  Post-op Assessment: Report given to RN and Post -op Vital signs reviewed and stable  Post vital signs: Reviewed and stable  Last Vitals:  Vitals:   02/14/16 2325 02/15/16 0448  BP: 124/62 (!) 102/57  Pulse: 67 66  Resp: 20 20  Temp: 37.2 C 36.6 C    Last Pain:  Vitals:   02/15/16 0448  TempSrc: Oral  PainSc:       Patients Stated Pain Goal: 0 (02/15/16 0239)  Complications: No apparent anesthesia complications

## 2016-02-15 NOTE — Progress Notes (Signed)
ANTICOAGULATION CONSULT NOTE  Pharmacy Consult for Heparin Indication: atrial fibrillation   Heparin dosing weight: 94.6 kg  Assessment: 56 yoM admitted 2/9 for groin wound dehiscence s/p L iliofemoral endarterectomy. Patient is on Eliquis at home for PAF, s/p OR 2/10 for debridement (possibly will go back Monday per RN). Pharmacy consulted for heparin dosing. Last dose of Eliquis was 2/9 AM, baseline aPTT 32, heparin level 0.35 - will dose using aPTTs while Eliquis influencing heparin levels. CBC wnl.   APTT low this afternoon at 38. Per discussion with RN, heparin drip was not turned off at any time to her knowledge, no issues with IV line or bleeding reported.  Goal of Therapy:  Heparin level 0.3-0.7 units/ml aPTT 66-102 seconds Monitor platelets by anticoagulation protocol: Yes   Plan:  Increase heparin infusion to 1750 units/hr Check aPTT and heparin level in 6 hours  Monitor daily aPTT/heparin level and CBC Monitor for s/sx bleeding Follow-up OR plans   Allergies  Allergen Reactions  . No Known Allergies     Patient Measurements: Height: 5\' 8"  (172.7 cm) Weight: 255 lb (115.7 kg) IBW/kg (Calculated) : 68.4 Heparin Dosing Weight: 94.6 kg  Vital Signs: Temp: 98 F (36.7 C) (02/10 1427) Temp Source: Oral (02/10 1427) BP: 124/63 (02/10 1630) Pulse Rate: 67 (02/10 1630)  Labs:  Recent Labs  02/14/16 1909 02/14/16 1914 02/15/16 1828  HGB 15.0  --   --   HCT 43.2  --   --   PLT 289  --   --   APTT 32  --  38*  LABPROT 14.6  --   --   INR 1.13  --   --   HEPARINUNFRC  --   --  0.35  CREATININE  --  0.98  --     Estimated Creatinine Clearance: 103.9 mL/min (by C-G formula based on SCr of 0.98 mg/dL).   Medical History: Past Medical History:  Diagnosis Date  . Acute ST-segment elevation myocardial infarction (HCC) 07/2014  . Anxiety   . Bilateral leg edema   . CAD (coronary artery disease)   . CHF (congestive heart failure) (HCC)   . Chronic venous  insufficiency   . Falling episodes   . High cholesterol   . History of kidney stones    "in my 20's and 30's"  . Hyperlipidemia   . Hypertension    hx (02/14/2016)  . Hypokalemia   . Left ventricular systolic dysfunction   . Paroxysmal atrial fibrillation (HCC)   . Pneumonia ~ 2013  . Sleep apnea    "tried it; didn't like it; need to get one/PCP" (02/14/2016)  . Sternum pain   . Type II diabetes mellitus (HCC)     Medications:  Infusions:  . 0.9 % NaCl with KCl 20 mEq / L Stopped (02/15/16 0640)  . heparin 1,400 Units/hr (02/15/16 1243)     Babs BertinHaley Lincoln Ginley, PharmD, BCPS Clinical Pharmacist 02/15/2016 7:41 PM

## 2016-02-16 ENCOUNTER — Encounter (HOSPITAL_COMMUNITY): Payer: Self-pay | Admitting: Vascular Surgery

## 2016-02-16 LAB — HEMOGLOBIN A1C
HEMOGLOBIN A1C: 11.9 % — AB (ref 4.8–5.6)
Mean Plasma Glucose: 295 mg/dL

## 2016-02-16 LAB — GLUCOSE, CAPILLARY
GLUCOSE-CAPILLARY: 177 mg/dL — AB (ref 65–99)
GLUCOSE-CAPILLARY: 144 mg/dL — AB (ref 65–99)
Glucose-Capillary: 226 mg/dL — ABNORMAL HIGH (ref 65–99)
Glucose-Capillary: 193 mg/dL — ABNORMAL HIGH (ref 65–99)
Glucose-Capillary: 179 mg/dL — ABNORMAL HIGH (ref 65–99)

## 2016-02-16 LAB — APTT
APTT: 66 s — AB (ref 24–36)
APTT: 93 s — AB (ref 24–36)

## 2016-02-16 LAB — HEPARIN LEVEL (UNFRACTIONATED): HEPARIN UNFRACTIONATED: 0.46 [IU]/mL (ref 0.30–0.70)

## 2016-02-16 MED ORDER — PROMETHAZINE HCL 25 MG/ML IJ SOLN: 12.5000 mg | Freq: Once | INTRAMUSCULAR | Status: DC

## 2016-02-16 NOTE — Progress Notes (Signed)
ANTICOAGULATION CONSULT NOTE - Follow-up Consult  Pharmacy Consult for Heparin Indication: atrial fibrillation  Allergies  Allergen Reactions  . No Known Allergies     Patient Measurements: Height: 5\' 8"  (172.7 cm) Weight: 255 lb (115.7 kg) IBW/kg (Calculated) : 68.4 Heparin Dosing Weight: 94.6 kg  Vital Signs: Temp: 98.4 F (36.9 C) (02/10 2129) BP: 156/87 (02/10 2129) Pulse Rate: 76 (02/10 2129)  Labs:  Recent Labs  02/14/16 1909 02/14/16 1914 02/15/16 1828 02/16/16 0147  HGB 15.0  --   --   --   HCT 43.2  --   --   --   PLT 289  --   --   --   APTT 32  --  38* 66*  LABPROT 14.6  --   --   --   INR 1.13  --   --   --   HEPARINUNFRC  --   --  0.35 0.46  CREATININE  --  0.98  --   --     Estimated Creatinine Clearance: 103.9 mL/min (by C-G formula based on SCr of 0.98 mg/dL).  Assessment: [age] yoM admitted 2/9 for groin wound dehiscence s/p L iliofemoral endarterectomy. Patient is on Eliquis at home for PAF (last dose 2/9 am). Since patient is planned to go to the OR today for debridement, pharmacy has been consulted for heparin dosing. PTT 66 sec (low end of therapeutic) on gtt at 1750 units/hr. Utilizing PTT while heparin level affected by Eliquis. No bleeding noted.  Goal of Therapy:  Heparin level 0.3-0.7 units/ml aPTT 66-102 seconds Monitor platelets by anticoagulation protocol: Yes   Plan:  Increase heparin infusion slightly to 1850 units/hr to keep in therapeutic range Check PTT in 6 hours Follow-up OR plans  Christoper Fabianaron Lesly Joslyn, PharmD, BCPS Clinical pharmacist, pager [phone] 02/16/2016 2:59 AM

## 2016-02-16 NOTE — Progress Notes (Signed)
  Progress Note    02/16/2016 12:00 PM 1 Day Post-Op  Subjective:  Nauseated this a.m.  Vitals:   02/16/16 0503 02/16/16 0821  BP: (!) 106/57 121/65  Pulse: 69   Resp: 19   Temp: 98.2 F (36.8 C)     Physical Exam: aaox3 Left groin vac to suction L foot is warm with 1+ edema   CBC    Component Value Date/Time   WBC 13.9 (H) 02/14/2016 1909   RBC 4.74 02/14/2016 1909   HGB 15.0 02/14/2016 1909   HCT 43.2 02/14/2016 1909   PLT 289 02/14/2016 1909   MCV 91.1 02/14/2016 1909   MCH 31.6 02/14/2016 1909   MCHC 34.7 02/14/2016 1909   RDW 13.2 02/14/2016 1909   LYMPHSABS 3.1 02/14/2016 1909   MONOABS 1.2 (H) 02/14/2016 1909   EOSABS 0.5 02/14/2016 1909   BASOSABS 0.1 02/14/2016 1909    BMET    Component Value Date/Time   NA 133 (L) 02/14/2016 1914   K 3.4 (L) 02/14/2016 1914   CL 92 (L) 02/14/2016 1914   CO2 27 02/14/2016 1914   GLUCOSE 294 (H) 02/14/2016 1914   BUN 13 02/14/2016 1914   CREATININE 0.98 02/14/2016 1914   CALCIUM 8.6 (L) 02/14/2016 1914   GFRNONAA >60 02/14/2016 1914   GFRAA >60 02/14/2016 1914    INR    Component Value Date/Time   INR 1.13 02/14/2016 1909     Intake/Output Summary (Last 24 hours) at 02/16/16 1200 Last data filed at 02/16/16 [id number]  Gross per 24 hour  Intake           973.54 ml  Output              752 ml  Net           221.54 ml     Assessment:  56 y.o. male is s/p wound vac placement of left groin for fat necrosis  Plan: Wound vac change tomorrow Will need home vac Transition back to eliquis when no other surgery required, continue heparin ggt for now   ElburnBrandon C. [name], MD [facility] [place] Office: [phone] Pager: [phone]  02/16/2016 12:00 PM

## 2016-02-16 NOTE — Progress Notes (Signed)
ANTICOAGULATION CONSULT NOTE - Follow-up Consult  Pharmacy Consult for Heparin Indication: atrial fibrillation  Allergies  Allergen Reactions  . No Known Allergies     Patient Measurements: Height: 5\' 8"  (172.7 cm) Weight: 255 lb (115.7 kg) IBW/kg (Calculated) : 68.4 Heparin Dosing Weight: 94.6 kg  Vital Signs: Temp: 98.2 F (36.8 C) (02/11 0503) BP: 121/65 (02/11 0821) Pulse Rate: 69 (02/11 0503)  Labs:  Recent Labs  02/14/16 1909 02/14/16 1914 02/15/16 1828 02/16/16 0147 02/16/16 0834  HGB 15.0  --   --   --   --   HCT 43.2  --   --   --   --   PLT 289  --   --   --   --   APTT 32  --  38* 66* 93*  LABPROT 14.6  --   --   --   --   INR 1.13  --   --   --   --   HEPARINUNFRC  --   --  0.35 0.46  --   CREATININE  --  0.98  --   --   --     Estimated Creatinine Clearance: 103.9 mL/min (by C-G formula based on SCr of 0.98 mg/dL).  Assessment: [age] yoM admitted 2/9 for groin wound dehiscence s/p L iliofemoral endarterectomy. Patient is on Eliquis at home for PAF (last dose 2/9 am). Since patient is planned to go to the OR today for debridement, pharmacy has been consulted for heparin dosing. PTT 66 sec (low end of therapeutic) on gtt at 1750 units/hr. Utilizing PTT while heparin level affected by Eliquis. No bleeding noted.  Repeat aPTT remains therapeutic at 93 seconds on heparin 1850 units/hr. No issues with infusion or bleeding noted.  Goal of Therapy:  Heparin level 0.3-0.7 units/ml aPTT 66-102 seconds Monitor platelets by anticoagulation protocol: Yes   Plan:  Continue heparin 1850 units/hr Daily aPTT/HL/CBC Monitor s/sx of bleeding Follow-up OR plans  Arlean HoppingCorey M. Newman PiesBall, PharmD, BCPS Clinical Pharmacist [phone] 02/16/2016 11:38 AM

## 2016-02-16 NOTE — Progress Notes (Signed)
Pt asked for phenergan for nausea since the Zofran did not work for the pt.  Dr. [name] ordered 1 time dose of phenergan. According to pt the nausea was gone and did not want to phenergan at this time.

## 2016-02-17 LAB — GLUCOSE, CAPILLARY
GLUCOSE-CAPILLARY: 121 mg/dL — AB (ref 65–99)
GLUCOSE-CAPILLARY: 156 mg/dL — AB (ref 65–99)
GLUCOSE-CAPILLARY: 169 mg/dL — AB (ref 65–99)
Glucose-Capillary: 136 mg/dL — ABNORMAL HIGH (ref 65–99)

## 2016-02-17 LAB — APTT: APTT: 63 s — AB (ref 24–36)

## 2016-02-17 LAB — HEPARIN LEVEL (UNFRACTIONATED): Heparin Unfractionated: 0.38 IU/mL (ref 0.30–0.70)

## 2016-02-17 MED ORDER — APIXABAN 5 MG PO TABS
5.0000 mg | ORAL_TABLET | Freq: Two times a day (BID) | ORAL | Status: DC
  Administered 2016-02-17: 5 mg via ORAL
  Filled 2016-02-17: qty 1

## 2016-02-17 MED ORDER — OXYCODONE-ACETAMINOPHEN 5-325 MG PO TABS: 1.0000 | ORAL_TABLET | Freq: Four times a day (QID) | ORAL | 0 refills | Status: DC | PRN

## 2016-02-17 NOTE — Progress Notes (Signed)
   02/17/16 1115  Clinical Encounter Type  Visited With Patient and family together  Visit Type Other (Comment) ([place] consult)  Spiritual Encounters  Spiritual Needs Ritual  Stress Factors  Patient Stress Factors None identified  Family Stress Factors None identified  Pt and family previously seen. Offered anointing and prayer in Orthodox tradition.

## 2016-02-17 NOTE — Progress Notes (Signed)
  [facility] Progress Note  Vac dressing changed to left groin this am. Wound with moderate serosanguinous drainage when VAC removed. No active bleeding seen.  Wound bed is clean with red granulation tissue and minimal necrotic fat at perimeter. No purulence seen.  Will restart Eliquis today and d/c home once VAC set up. Case management consulted for home health RN and home VAC.   Maris BergerKimberly Trinh, PA-C [facility] Office: [phone] Pager: [phone] 02/17/2016 9:49 AM

## 2016-02-17 NOTE — Progress Notes (Signed)
  Progress Note    02/17/2016 7:49 AM 2 Days Post-Op  Subjective:  No complaints overnight  Vitals:   02/16/16 2202 02/17/16 0516  BP: (!) 101/53 (!) 126/53  Pulse: 63 65  Resp: 18 18  Temp: 99.8 F (37.7 C) 98.1 F (36.7 C)    Physical Exam: aaox3 Abdomen is soft Left foot is warm with 1+ edema of left leg Left groin wound vac to suction  CBC    Component Value Date/Time   WBC 13.9 (H) 02/14/2016 1909   RBC 4.74 02/14/2016 1909   HGB 15.0 02/14/2016 1909   HCT 43.2 02/14/2016 1909   PLT 289 02/14/2016 1909   MCV 91.1 02/14/2016 1909   MCH 31.6 02/14/2016 1909   MCHC 34.7 02/14/2016 1909   RDW 13.2 02/14/2016 1909   LYMPHSABS 3.1 02/14/2016 1909   MONOABS 1.2 (H) 02/14/2016 1909   EOSABS 0.5 02/14/2016 1909   BASOSABS 0.1 02/14/2016 1909    BMET    Component Value Date/Time   NA 133 (L) 02/14/2016 1914   K 3.4 (L) 02/14/2016 1914   CL 92 (L) 02/14/2016 1914   CO2 27 02/14/2016 1914   GLUCOSE 294 (H) 02/14/2016 1914   BUN 13 02/14/2016 1914   CREATININE 0.98 02/14/2016 1914   CALCIUM 8.6 (L) 02/14/2016 1914   GFRNONAA >60 02/14/2016 1914   GFRAA >60 02/14/2016 1914    INR    Component Value Date/Time   INR 1.13 02/14/2016 1909     Intake/Output Summary (Last 24 hours) at 02/17/16 0749 Last data filed at 02/17/16 0620  Gross per 24 hour  Intake          1279.04 ml  Output              100 ml  Net          1179.04 ml     Assessment:  56 y.o. male is debridement of left groin with vac placement  Plan: Wound vac change today and will need set up for home Restart eliquis if groin looks ok   Vernice Mannina C. [name], MD [facility] [place] Office: [phone] Pager: [phone]  02/17/2016 7:49 AM

## 2016-02-17 NOTE — Progress Notes (Signed)
Marcus RoseWilliam Glass to be D/C'd  With family per MD order.  Discussed with the patient and all questions fully answered.covered in depth the wound vac and procedure used with wound vac.  VSS, Skin clean, dry and intact without evidence of skin break down, no evidence of skin tears noted. IV catheter discontinued intact. Site without signs and symptoms of complications. Dressing and pressure applied.  An After Visit Summary was printed and given to the patient. Patient received prescription.  D/c education completed with patient/family including follow up instructions, medication list, d/c activities limitations if indicated, with other d/c instructions as indicated by MD - patient able to verbalize understanding, all questions fully answered.   Patient instructed to return to ED, call 911, or call MD for any changes in condition.   Patient escorted via WC, and D/C home via private auto.  Marcus BioLoren D Wrenna Glass 02/17/2016 6:54 PM

## 2016-02-17 NOTE — Discharge Instructions (Signed)

## 2016-02-17 NOTE — Care Management Note (Addendum)
Case Management Note  Patient Details  Name: Marcus Glass MRN: [id number] Date of Birth: 01/27/1960  Subjective/Objective:         CM received consult: Needs home VAC and RN for dressing changes q MWF.          Action/Plan:  Plan is to d/c to home today with wound vac and home health service (RN).  Expected Discharge Date:    02/17/2016       Expected Discharge Plan:  Home w Home Health Services  In-House Referral:     Discharge planning Services  CM Consult  Post Acute Care Choice:    Choice offered to:  Patient  DME Arranged:  Vac DME Agency:  [facility] @ [phone]   [facility]:  [facility]  [facility]/ [facility] @ [phone].  CM faxed demographics, H&P, d/csummary, face to face and home health order to [phone]. [facility] confirmed information received.  Status of Service:  Completed, signed off  If discussed at Long Length of Stay Meetings, dates discussed:    Additional Comments:  Epifanio LeschesCole, Janeane Cozart Hudson, RN 02/17/2016, 1:12 PM

## 2016-02-18 ENCOUNTER — Telehealth: Payer: Self-pay

## 2016-02-18 NOTE — Telephone Encounter (Signed)
Phone call from pt.  Reported onset of nausea and vomiting last night.  Stated he was released from hospital yesterday.  Reported vomiting and diarrhea approx. 3-4 times, each.  C/o "low energy, slight headache."  Wound vac remains in place.  Stated the drainage from wound has decreased; reported changed  at hospital yesterday, and scheduled to be changed tomorrow.  Reported that his wife called his PCP, and no recommendations were given.  Reported pt's complaints to Dr. Arbie CookeyEarly.  Advised from surgical standpoint, there is nothing further to recommend at this time.  Called pt. Back and advised he should try to hydrate with ice chips and popsicles, and to again notify PCP of persistent symptoms.  Advised to call our office with any concerns with left groin wound.  Pt. Verb. Understanding.

## 2016-02-20 ENCOUNTER — Telehealth: Payer: Self-pay

## 2016-02-20 NOTE — Telephone Encounter (Signed)
rec'd call from pt's wife.  Voiced concern re: change in color of area in left groin wound bed.  [facility].  Stated that she saw the left groin wound in the hospital before discharge, and "it looked red like ground beef."  Now described the wound as having "gray tissue on one side of the wound bed."  HH RN at pt's home, at time of this phone call.  [facility] she [facility]'t [facility], and that she was there to trouble-shoot the alarm, and noted a kink in the tubing.  Stated the drainage in the tubing is "light red to  amber". Reported no fever/ chills.  [facility] requested verbal orders to change wound vac M-W-F, and teach pt's family how to apply wet to dry dressing if the wound vac isn't working. Verbal orders granted for the above.  Advised pt's. wife that will update Dr. [name] of the changes in the wound bed.  Agreed with plan.

## 2016-02-20 NOTE — Discharge Summary (Signed)
[facility] Discharge Summary  Marcus Glass 05/26/1960 56 y.o. male  [id number]  Admission Date: 02/14/2016  Discharge Date: 02/17/16  Physician: Lemar Livings, MD  Admission Diagnosis: Suture Issues left groin wound  HPI:   This is a 56 y.o. male  s/p left iliofem endartectomy for rest pain with depressed abi. Now returns with a few days of draingage that appeared bloody today on the gauze. He otherwise has associated swelling of his left leg but rest pain has resolved. Continues to take eliquis. Denies fever or constitutional symptoms   Hospital Course:  The patient was admitted to the hospital on 02/24/16  and taken to the operating room on  02/15/2016 and underwent: exploration of left groin with placement of wound vac 5 x 3 x 3 cm.   Findings: There was fat necrosis just below the surface of the incision which was opened in its entirety. There was still tissue coverage of the artery which could be palpated in the deepest part of the wound. Wound was irrigated.  The patient tolerated the procedure well and was transported to the PACU in good condition. He was placed on a heparin drip for atrial fibrillation.    The patient did well post-op. His wound vac dressing was taken on POD 2. The wound appeared clean with red granulation tissue. There was no exposed artery. There was minimal fat necrosis at perimeter. Wound vac was ordered for home and home health was arranged for dressing changes. He was restarted back on Eliquis and discharged home on POD 2 in good condition.   CBC    Component Value Date/Time   WBC 13.9 (H) 02/14/2016 1909   RBC 4.74 02/14/2016 1909   HGB 15.0 02/14/2016 1909   HCT 43.2 02/14/2016 1909   PLT 289 02/14/2016 1909   MCV 91.1 02/14/2016 1909   MCH 31.6 02/14/2016 1909   MCHC 34.7 02/14/2016 1909   RDW 13.2 02/14/2016 1909   LYMPHSABS 3.1 02/14/2016 1909   MONOABS 1.2 (H) 02/14/2016 1909   EOSABS 0.5 02/14/2016 1909   BASOSABS  0.1 02/14/2016 1909    BMET    Component Value Date/Time   NA 133 (L) 02/14/2016 1914   K 3.4 (L) 02/14/2016 1914   CL 92 (L) 02/14/2016 1914   CO2 27 02/14/2016 1914   GLUCOSE 294 (H) 02/14/2016 1914   BUN 13 02/14/2016 1914   CREATININE 0.98 02/14/2016 1914   CALCIUM 8.6 (L) 02/14/2016 1914   GFRNONAA >60 02/14/2016 1914   GFRAA >60 02/14/2016 1914     Discharge Instructions:   The patient is discharged to home with extensive instructions on wound care and progressive ambulation.  They are instructed not to drive or perform any heavy lifting until returning to see the physician in his office.  Discharge Instructions    Activity as tolerated - No restrictions    Complete by:  As directed    Call MD for:  redness, tenderness, or signs of infection (pain, swelling, bleeding, redness, odor or green/yellow discharge around incision site)    Complete by:  As directed    Call MD for:  severe or increased pain, loss or decreased feeling  in affected limb(s)    Complete by:  As directed    Call MD for:  temperature >100.5    Complete by:  As directed    Discharge wound care:    Complete by:  As directed    Wound vac dressing change every Monday, Wednesday and  Friday.   Driving Restrictions    Complete by:  As directed    No driving while on pain medication   Resume previous diet    Complete by:  As directed       Discharge Diagnosis:  Suture Issues left groin wound  Secondary Diagnosis: Patient Active Problem List   Diagnosis Date Noted  . Groin pain 02/14/2016  . Non-healing left groin open wound 02/14/2016  . Vascular claudication (HCC) 01/26/2016  . PAD (peripheral artery disease) (HCC) 01/26/2016  . Intractable pain 01/26/2016  . Bed sore on buttock 01/26/2016  . Diabetes mellitus type II, non insulin dependent (HCC) 01/26/2016  . CAD (coronary artery disease) 01/26/2016  . PAF (paroxysmal atrial fibrillation) (HCC) 01/26/2016  . Hyponatremia 01/26/2016  .  Chronic diastolic CHF (congestive heart failure) (HCC) 01/26/2016   Past Medical History:  Diagnosis Date  . Acute ST-segment elevation myocardial infarction (HCC) 07/2014  . Anxiety   . Bilateral leg edema   . CAD (coronary artery disease)   . CHF (congestive heart failure) (HCC)   . Chronic venous insufficiency   . Falling episodes   . High cholesterol   . History of kidney stones    "in my 20's and 30's"  . Hyperlipidemia   . Hypertension    hx (02/14/2016)  . Hypokalemia   . Left ventricular systolic dysfunction   . Paroxysmal atrial fibrillation (HCC)   . Pneumonia ~ 2013  . Sleep apnea    "tried it; didn't like it; need to get one/PCP" (02/14/2016)  . Sternum pain   . Type II diabetes mellitus (HCC)      Allergies as of 02/17/2016      Reactions   No Known Allergies       Medication List    TAKE these medications   albuterol 108 (90 Base) MCG/ACT inhaler Commonly known as:  PROVENTIL HFA;VENTOLIN HFA Inhale 2 puffs into the lungs every 6 (six) hours as needed for wheezing or shortness of breath.   amiodarone 200 MG tablet Commonly known as:  PACERONE Take 200 mg by mouth daily.   amitriptyline 25 MG tablet Commonly known as:  ELAVIL Take 25 mg by mouth at bedtime as needed for sleep.   aspirin EC 81 MG tablet Take 81 mg by mouth daily.   atorvastatin 80 MG tablet Commonly known as:  LIPITOR Take 80 mg by mouth every morning.   carvedilol 6.25 MG tablet Commonly known as:  COREG Take 6.25 mg by mouth 2 (two) times daily with a meal.   clonazePAM 1 MG tablet Commonly known as:  KLONOPIN Take 1 mg by mouth 2 (two) times daily.   ELIQUIS 5 MG Tabs tablet Generic drug:  apixaban Take 5 mg by mouth 2 (two) times daily.   furosemide 40 MG tablet Commonly known as:  LASIX Take 120 mg by mouth 2 (two) times daily.   insulin aspart 100 UNIT/ML injection Commonly known as:  novoLOG Inject 4 Units into the skin 3 (three) times daily with meals.     insulin glargine 100 UNIT/ML injection Commonly known as:  LANTUS Inject 0.2 mLs (20 Units total) into the skin daily. What changed:  when to take this   lisinopril 5 MG tablet Commonly known as:  PRINIVIL,ZESTRIL Take 5 mg by mouth daily.   metFORMIN 1000 MG tablet Commonly known as:  GLUCOPHAGE Take 1,000 mg by mouth 2 (two) times daily with a meal.   nitroGLYCERIN 0.4 MG SL tablet Commonly  known as:  NITROSTAT Place 0.4 mg under the tongue every 5 (five) minutes as needed for chest pain.   nystatin-triamcinolone cream Commonly known as:  MYCOLOG II Apply 1 application topically 2 (two) times daily as needed (rash).   oxyCODONE-acetaminophen 5-325 MG tablet Commonly known as:  PERCOCET/ROXICET Take 1-2 tablets by mouth every 6 (six) hours as needed for moderate pain. What changed:  when to take this   potassium chloride 20 MEQ packet Commonly known as:  KLOR-CON Take 20 mEq by mouth daily.   pregabalin 50 MG capsule Commonly known as:  LYRICA Take 50 mg by mouth 2 (two) times daily.       Percocet #30 No Refill  Disposition: home  Patient's condition: is Good  Follow up: 1. Dr. Randie Heinz in 2 weeks   Maris Berger, PA-C [facility] [phone] 02/20/2016  7:32 AM

## 2016-02-20 NOTE — Telephone Encounter (Signed)
Discussed wife's concern of change in appearance of wound bed, (L) groin.  Recommended to [facility] office with assessment tomorrow, when the wound vac is changed.  Marice Potterontacted Karen, [facility].  Advised of above recommendation.  Verb. Understanding.  Wife was also made aware of plan.

## 2016-02-21 NOTE — Telephone Encounter (Signed)
Rec'd phone call from pt's. Wife.  [facility], and has assessed the left groin wound. [facility], Debbie, "the peri-wound is intact and is a reddish-pink color; on left upper perimeter of the wound and on right side in deeper area of wound, there is a yellow exudate."  Also, reported there was "frank bleeding" from wound bed with removal of vac and sponge, but the bleeding stopped within short time.  Per [facility] RN, "I think the wound bed looks good."  Wife was in question with [facility] assessment.  Offered that I could discuss with Dr. [name], and arrange for pt. to be checked by Dr. [name], at the hospital ER today, if wife is uncomfortable with the wound status.  Per wife, she will further discuss [facility]; "to make sure we are on the same page."   Discussed with Eunice BlaseDebbie,  [facility] RN, plan to call office Monday with update on pt's. wound; agreed.  Wife also in agreement w/ plan.  Pt. has a f/u appt. with Dr. [name] on 02/28/16.

## 2016-02-25 ENCOUNTER — Encounter: Payer: Self-pay | Admitting: Vascular Surgery

## 2016-02-25 ENCOUNTER — Encounter (HOSPITAL_COMMUNITY): Payer: Medicaid Other

## 2016-02-25 ENCOUNTER — Telehealth: Payer: Self-pay

## 2016-02-28 ENCOUNTER — Encounter: Payer: Self-pay | Admitting: Vascular Surgery

## 2016-02-28 ENCOUNTER — Ambulatory Visit (INDEPENDENT_AMBULATORY_CARE_PROVIDER_SITE_OTHER): Payer: Medicaid Other | Admitting: Vascular Surgery

## 2016-02-28 VITALS — BP 104/69 | HR 66 | Temp 98.6°F | Resp 20 | Ht 68.0 in | Wt 248.0 lb

## 2016-02-28 DIAGNOSIS — T8189XD Other complications of procedures, not elsewhere classified, subsequent encounter: Secondary | ICD-10-CM

## 2016-02-28 NOTE — Progress Notes (Signed)
Subjective:     Patient ID: Marcus Glass, male   DOB: 11/15/1960, 56 y.o.   MRN: [id number]  HPI Marcus Glass returns for follow-up of recent wound of left groin was debrided in the operating room and had wound VAC placed in the hospital. He was turned on his request for atrial fibrillation. He is having wound care nurse change his wound VAC 3 times a week at home and he has not had fevers and not require antibiotics. There was some concern about drainage from his wound as well as foul smell this is mostly resolved. He is overall happy with his progress and his left lower extremity is feeling much better following revascularization.   Review of Systems Left groin wound with pain, minimal drainage    Objective:   Physical Exam aaox3 Abdomen is soft, ntnd Left groin wound 6 x 3 cm with granulation medially, lateral and deep are areas of fibrinous exudate    Assessment/plan     56yo white male returns for follow-up of left groin wound following endarterectomy and patch angioplasty. He does have good granulation tissue. The wound was debrided bedside today of fibrinous exudate and there was good healthy bleeding beneath that tissue. Wet-to-dry dressing was placed and he will have his wound VAC placement he returns home. I will see him in 1 month to follow up should he have no issues between now and then.   Gustave Lindeman C. [name], MD [facility] [place] Office: [phone] Pager: [phone]

## 2016-03-02 ENCOUNTER — Encounter (HOSPITAL_COMMUNITY): Payer: Self-pay

## 2016-03-02 ENCOUNTER — Encounter: Payer: Self-pay | Admitting: Surgery

## 2016-03-16 ENCOUNTER — Encounter: Payer: Self-pay | Admitting: Vascular Surgery

## 2016-03-23 ENCOUNTER — Telehealth: Payer: Self-pay

## 2016-03-23 NOTE — Telephone Encounter (Signed)
[facility].  Reported the pt. Reported he started having discomfort in left groin area over the weekend.  Stated the wound vac was removed today for dressing change.  HH RN noted there was "red spots with white head in lower abdomen into the left groin area, where the wound vac adhesive had been in contact with the skin.  Stated the wound bed looks beefy red, and is healing. Stated there is a "v-shaped area, proximal to the left hip that pt. voices being more tender.  Reported no fever/ chills.  Stated she is concerned he has reacted to the Wound Vac.  Noted pt. has f/u appt. on 3/23.   Recommended to change to wet to dry dressings BID, and hold the Wound Vac, until seen in office on 3/23.  Advised will notify with any change in wound care orders. HH RN verb. understanding.

## 2016-03-27 ENCOUNTER — Encounter: Payer: Self-pay | Admitting: Vascular Surgery

## 2016-03-27 ENCOUNTER — Ambulatory Visit (INDEPENDENT_AMBULATORY_CARE_PROVIDER_SITE_OTHER): Payer: Self-pay | Admitting: Vascular Surgery

## 2016-03-27 VITALS — BP 103/69 | HR 61 | Temp 97.0°F | Resp 18 | Ht 68.0 in | Wt 246.0 lb

## 2016-03-27 DIAGNOSIS — T8189XD Other complications of procedures, not elsewhere classified, subsequent encounter: Secondary | ICD-10-CM

## 2016-03-27 DIAGNOSIS — I739 Peripheral vascular disease, unspecified: Secondary | ICD-10-CM

## 2016-03-27 NOTE — Progress Notes (Signed)
Subjective:     Patient ID: Marcus Glass, male   DOB: 02/04/1960, 56 y.o.   MRN: [id number]  HPI 56 year old male follows up from recent left femoral endarterectomy, good by wound breakdown. He now has mostly healed his wound with the help of now wet-to-dry dressings and home health nursing. He is not having any fevers chills. States that his left leg is much better after the surgery. He is excited to have his wound healing. He remains on [name] for atrial fibrillation.   Review of Systems Left groin wound    Objective:   Physical Exam aaox3 Non labored respirations Well healing left groin wound with granulation tissue at base, no erythema Monophasic left dp/pt    Assessment/plan   56yo male follows up for wound check of his left groin. He is status post left femoral endarterectomy and his pain is left leg is doing much better since his surgery. He is now healing his wound well with wet-to-dry dressings and is nearly closed with good granulation tissue. He will continue wet-to-dry dressings until has completely healed. He will follow-up in 3 months with repeat ABIs should he not have issues before then.    Reyes Aldaco C. [name], MD [facility] [place] Office: [phone] Pager: [phone]

## 2016-03-31 NOTE — Addendum Note (Signed)
Addended by: Burton ApleyPETTY, Coni Homesley A on: 03/31/2016 09:58 AM   Modules accepted: Orders

## 2016-05-16 DIAGNOSIS — I10 Essential (primary) hypertension: Secondary | ICD-10-CM | POA: Diagnosis not present

## 2016-05-16 DIAGNOSIS — I509 Heart failure, unspecified: Secondary | ICD-10-CM | POA: Diagnosis not present

## 2016-05-16 DIAGNOSIS — R0789 Other chest pain: Secondary | ICD-10-CM | POA: Diagnosis not present

## 2016-05-16 DIAGNOSIS — I4891 Unspecified atrial fibrillation: Secondary | ICD-10-CM | POA: Diagnosis not present

## 2016-05-16 DIAGNOSIS — E1142 Type 2 diabetes mellitus with diabetic polyneuropathy: Secondary | ICD-10-CM | POA: Diagnosis not present

## 2016-05-16 DIAGNOSIS — E785 Hyperlipidemia, unspecified: Secondary | ICD-10-CM | POA: Diagnosis not present

## 2016-05-16 DIAGNOSIS — I2510 Atherosclerotic heart disease of native coronary artery without angina pectoris: Secondary | ICD-10-CM | POA: Diagnosis not present

## 2016-06-08 NOTE — Addendum Note (Signed)
Addendum  created 06/08/16 1102 by Dallie Patton, MD   Sign clinical note    

## 2016-06-17 ENCOUNTER — Encounter: Payer: Self-pay | Admitting: Family

## 2016-06-30 ENCOUNTER — Encounter: Payer: Self-pay | Admitting: Family

## 2016-06-30 ENCOUNTER — Ambulatory Visit (INDEPENDENT_AMBULATORY_CARE_PROVIDER_SITE_OTHER): Payer: Medicaid Other | Admitting: Family

## 2016-06-30 ENCOUNTER — Ambulatory Visit (HOSPITAL_COMMUNITY)
Admission: RE | Admit: 2016-06-30 | Discharge: 2016-06-30 | Disposition: A | Discharge status: Home / Self Care | Payer: Medicaid Other | Source: Ambulatory Visit | Attending: Vascular Surgery | Admitting: Vascular Surgery

## 2016-06-30 VITALS — BP 124/84 | HR 63 | Temp 97.0°F | Resp 18 | Ht 68.0 in | Wt 250.0 lb

## 2016-06-30 DIAGNOSIS — I739 Peripheral vascular disease, unspecified: Secondary | ICD-10-CM | POA: Insufficient documentation

## 2016-06-30 DIAGNOSIS — T8189XD Other complications of procedures, not elsewhere classified, subsequent encounter: Secondary | ICD-10-CM | POA: Insufficient documentation

## 2016-06-30 DIAGNOSIS — E1151 Type 2 diabetes mellitus with diabetic peripheral angiopathy without gangrene: Secondary | ICD-10-CM

## 2016-06-30 DIAGNOSIS — IMO0002 Reserved for concepts with insufficient information to code with codable children: Secondary | ICD-10-CM

## 2016-06-30 DIAGNOSIS — Z87891 Personal history of nicotine dependence: Secondary | ICD-10-CM

## 2016-06-30 DIAGNOSIS — I779 Disorder of arteries and arterioles, unspecified: Secondary | ICD-10-CM

## 2016-06-30 DIAGNOSIS — E1165 Type 2 diabetes mellitus with hyperglycemia: Secondary | ICD-10-CM | POA: Diagnosis not present

## 2016-06-30 NOTE — Patient Instructions (Signed)
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
## 2016-08-27 ENCOUNTER — Encounter (INDEPENDENT_AMBULATORY_CARE_PROVIDER_SITE_OTHER): Payer: Self-pay

## 2016-08-27 ENCOUNTER — Encounter: Payer: Self-pay | Admitting: Sports Medicine

## 2016-08-27 ENCOUNTER — Ambulatory Visit (INDEPENDENT_AMBULATORY_CARE_PROVIDER_SITE_OTHER): Payer: Medicaid Other | Admitting: Sports Medicine

## 2016-08-27 DIAGNOSIS — L600 Ingrowing nail: Secondary | ICD-10-CM

## 2016-08-27 DIAGNOSIS — M19072 Primary osteoarthritis, left ankle and foot: Secondary | ICD-10-CM

## 2016-08-27 DIAGNOSIS — E1142 Type 2 diabetes mellitus with diabetic polyneuropathy: Secondary | ICD-10-CM | POA: Diagnosis not present

## 2016-08-27 DIAGNOSIS — I739 Peripheral vascular disease, unspecified: Secondary | ICD-10-CM

## 2016-08-27 DIAGNOSIS — Z7901 Long term (current) use of anticoagulants: Secondary | ICD-10-CM

## 2016-08-27 DIAGNOSIS — M79674 Pain in right toe(s): Secondary | ICD-10-CM | POA: Diagnosis not present

## 2016-08-27 DIAGNOSIS — M79675 Pain in left toe(s): Secondary | ICD-10-CM

## 2016-08-27 DIAGNOSIS — M19071 Primary osteoarthritis, right ankle and foot: Secondary | ICD-10-CM

## 2016-08-27 NOTE — Progress Notes (Signed)
   Subjective:    Patient ID: Marcus Glass, male    DOB: 1960/07/16, 56 y.o.   MRN: [id number]  HPI  I have had several ingrown toe nails removed but I always seem to mess it up.    Review of Systems  HENT: Positive for sinus pain and sinus pressure.   Eyes: Positive for visual disturbance.  Cardiovascular: Positive for chest pain, palpitations and leg swelling.  Genitourinary: Positive for frequency and urgency.  Musculoskeletal: Positive for back pain, gait problem and myalgias.  All other systems reviewed and are negative.      Objective:   Physical Exam        Assessment & Plan:

## 2016-08-28 NOTE — Progress Notes (Signed)
Subjective: Marcus Glass is a 56 y.o. male patient with history of diabetes who presents to office today complaining of painful nails at corners on big toes. Patient states that he has had the corners removed multiple times over the last 6-8 use and is interested in a ingrown nail procedure. Patient states the nail. Pain is most present with ambulating in shoes or direct pressure; unable to trim out the corners. However, has tried on several occasions himself. Patient states that the glucose reading this morning was not recorded. However, has been elevated. Patient is currently on Eliquis and reports that he had a vascular procedure earlier this year, however, that area was slow to heal and required Wound VAC. Patient denies any other acute symptoms at this time.  Patient Active Problem List   Diagnosis Date Noted  . Groin pain 02/14/2016  . Non-healing left groin open wound 02/14/2016  . Vascular claudication (HCC) 01/26/2016  . PAD (peripheral artery disease) (HCC) 01/26/2016  . Intractable pain 01/26/2016  . Bed sore on buttock 01/26/2016  . Diabetes mellitus type II, non insulin dependent (HCC) 01/26/2016  . CAD (coronary artery disease) 01/26/2016  . PAF (paroxysmal atrial fibrillation) (HCC) 01/26/2016  . Hyponatremia 01/26/2016  . Chronic diastolic CHF (congestive heart failure) (HCC) 01/26/2016   Current Outpatient Prescriptions on File Prior to Visit  Medication Sig Dispense Refill  . albuterol (PROVENTIL HFA;VENTOLIN HFA) 108 (90 Base) MCG/ACT inhaler Inhale 2 puffs into the lungs every 6 (six) hours as needed for wheezing or shortness of breath.     Marland Kitchen amiodarone (PACERONE) 200 MG tablet Take 200 mg by mouth daily.     Marland Kitchen apixaban (ELIQUIS) 5 MG TABS tablet Take 5 mg by mouth 2 (two) times daily.    Marland Kitchen aspirin EC 81 MG tablet Take 81 mg by mouth daily.    Marland Kitchen atorvastatin (LIPITOR) 80 MG tablet Take 80 mg by mouth every morning.     . carvedilol (COREG) 6.25 MG tablet Take 6.25 mg  by mouth 2 (two) times daily with a meal.    . clonazePAM (KLONOPIN) 1 MG tablet Take 1 mg by mouth 2 (two) times daily.     . furosemide (LASIX) 40 MG tablet Take 120 mg by mouth 2 (two) times daily.     . insulin aspart (NOVOLOG) 100 UNIT/ML injection Inject 4 Units into the skin 3 (three) times daily with meals. 10 mL 11  . insulin glargine (LANTUS) 100 UNIT/ML injection Inject 0.2 mLs (20 Units total) into the skin daily. (Patient taking differently: Inject 20 Units into the skin at bedtime. ) 10 mL 11  . lisinopril (PRINIVIL,ZESTRIL) 5 MG tablet Take 5 mg by mouth daily.    . metFORMIN (GLUCOPHAGE) 1000 MG tablet Take 1,000 mg by mouth 2 (two) times daily with a meal.    . nitroGLYCERIN (NITROSTAT) 0.4 MG SL tablet Place 0.4 mg under the tongue every 5 (five) minutes as needed for chest pain.     . potassium chloride (KLOR-CON) 20 MEQ packet Take 20 mEq by mouth daily.    Marland Kitchen amitriptyline (ELAVIL) 25 MG tablet Take 25 mg by mouth at bedtime as needed for sleep.     Marland Kitchen nystatin-triamcinolone (MYCOLOG II) cream Apply 1 application topically 2 (two) times daily as needed (rash).     Marland Kitchen oxyCODONE-acetaminophen (PERCOCET/ROXICET) 5-325 MG tablet Take 1-2 tablets by mouth every 6 (six) hours as needed for moderate pain. (Patient not taking: Reported on 06/30/2016) 30 tablet 0  .  pregabalin (LYRICA) 50 MG capsule Take 50 mg by mouth 2 (two) times daily.     No current facility-administered medications on file prior to visit.    Allergies  Allergen Reactions  . No Known Allergies     No results found for this or any previous visit (from the past 2160 hour(s)).  Objective: General: Patient is awake, alert, and oriented x 3 and in no acute distress.  Integument: Skin is warm, dry and supple bilateral. Nails are mildly incurvated at bilateral hallux with evidence that patient has been self trimming because nail margins are irregular. There is no surrounding nail fold edema, erythema, or active  drainage or acute infection. All other nails 2 through 5 bilateral are short, thick, and asymptomatic. No open lesions or preulcerative lesions present bilateral.   Vasculature:  Dorsalis Pedis pulse 0/4 bilateral. Posterior Tibial pulse  0/4 bilateral, difficult to palpate due to trace edema bilateral. Capillary fill time <5 sec 1-5 bilateral. Diminished hair growth to the level of the digits. Temperature gradient within normal limits. Brawny hyperpigmentation and trophic skin changes to bilateral legs.   Neurology: The patient has diminished sensation measured with a 5.07/10g Semmes Weinstein Monofilament at all pedal sites bilateral. Vibratory sensation diminished bilateral with tuning fork. No Babinski sign present bilateral.   Musculoskeletal: Palpable dorsal bone spurs at the first metatarsophalangeal joints bilateral, supportive of arthritis. Muscular strength 5/5 in all lower extremity muscular groups bilateral. No tenderness with calf compression bilateral.  Assessment and Plan: Problem List Items Addressed This Visit    None    Visit Diagnoses    Ingrown nail    -  Primary   Toe pain, bilateral       Diabetic polyneuropathy associated with type 2 diabetes mellitus (HCC)       Peripheral vascular disease (HCC)       Primary osteoarthritis of both feet       Long term current use of anticoagulant         -Examined patient. -Discussed and educated patient on diabetic foot care, especially with  regards to the vascular, neurological and musculoskeletal systems.  -Stressed the importance of good glycemic control and the detriment of not  controlling glucose levels in relation to the foot. -Advised patient that he is not a good candidate at this time for ingrown nail procedure. Due to high risk secondary to his peripheral vascular disease and diabetes with uncertain control and current use of anticoagulants. I advised patient that I would reach out to his vascular surgeon, Dr. Randie Heinz to  see if he is a good candidate for this procedure and if we receive medical clearance, may consider proceeding. -Complimentary debrided bilateral hallux nails using sterile nail nipper and filed with dremel without incident  -Answered all patient questions -Patient to return as needed -Patient advised to call the office if any problems or questions arise in the meantime.  Asencion Islam, DPM

## 2016-09-10 ENCOUNTER — Ambulatory Visit (INDEPENDENT_AMBULATORY_CARE_PROVIDER_SITE_OTHER): Payer: Medicaid Other | Admitting: Sports Medicine

## 2016-09-10 ENCOUNTER — Encounter (INDEPENDENT_AMBULATORY_CARE_PROVIDER_SITE_OTHER): Payer: Self-pay

## 2016-09-10 DIAGNOSIS — Z7901 Long term (current) use of anticoagulants: Secondary | ICD-10-CM

## 2016-09-10 DIAGNOSIS — E1142 Type 2 diabetes mellitus with diabetic polyneuropathy: Secondary | ICD-10-CM

## 2016-09-10 DIAGNOSIS — M79675 Pain in left toe(s): Secondary | ICD-10-CM

## 2016-09-10 DIAGNOSIS — L600 Ingrowing nail: Secondary | ICD-10-CM | POA: Diagnosis not present

## 2016-09-10 DIAGNOSIS — I739 Peripheral vascular disease, unspecified: Secondary | ICD-10-CM

## 2016-09-10 DIAGNOSIS — M79674 Pain in right toe(s): Secondary | ICD-10-CM

## 2016-09-10 NOTE — Patient Instructions (Signed)

## 2016-09-10 NOTE — Progress Notes (Signed)
Subjective: Marcus Glass is a 56 y.o. male patient with history of diabetes who presents to office today for ingrown nail procedures at both big toes. Denies any other acute issues.   Patient Active Problem List   Diagnosis Date Noted  . Groin pain 02/14/2016  . Non-healing left groin open wound 02/14/2016  . Vascular claudication (HCC) 01/26/2016  . PAD (peripheral artery disease) (HCC) 01/26/2016  . Intractable pain 01/26/2016  . Bed sore on buttock 01/26/2016  . Diabetes mellitus type II, non insulin dependent (HCC) 01/26/2016  . CAD (coronary artery disease) 01/26/2016  . PAF (paroxysmal atrial fibrillation) (HCC) 01/26/2016  . Hyponatremia 01/26/2016  . Chronic diastolic CHF (congestive heart failure) (HCC) 01/26/2016   Current Outpatient Prescriptions on File Prior to Visit  Medication Sig Dispense Refill  . albuterol (PROVENTIL HFA;VENTOLIN HFA) 108 (90 Base) MCG/ACT inhaler Inhale 2 puffs into the lungs every 6 (six) hours as needed for wheezing or shortness of breath.     Marland Kitchen amiodarone (PACERONE) 200 MG tablet Take 200 mg by mouth daily.     Marland Kitchen amitriptyline (ELAVIL) 25 MG tablet Take 25 mg by mouth at bedtime as needed for sleep.     Marland Kitchen apixaban (ELIQUIS) 5 MG TABS tablet Take 5 mg by mouth 2 (two) times daily.    Marland Kitchen aspirin EC 81 MG tablet Take 81 mg by mouth daily.    Marland Kitchen atorvastatin (LIPITOR) 80 MG tablet Take 80 mg by mouth every morning.     . carvedilol (COREG) 6.25 MG tablet Take 6.25 mg by mouth 2 (two) times daily with a meal.    . clonazePAM (KLONOPIN) 1 MG tablet Take 1 mg by mouth 2 (two) times daily.     . furosemide (LASIX) 40 MG tablet Take 120 mg by mouth 2 (two) times daily.     . insulin aspart (NOVOLOG) 100 UNIT/ML injection Inject 4 Units into the skin 3 (three) times daily with meals. 10 mL 11  . insulin glargine (LANTUS) 100 UNIT/ML injection Inject 0.2 mLs (20 Units total) into the skin daily. (Patient taking differently: Inject 20 Units into the skin at  bedtime. ) 10 mL 11  . lisinopril (PRINIVIL,ZESTRIL) 5 MG tablet Take 5 mg by mouth daily.    . metFORMIN (GLUCOPHAGE) 1000 MG tablet Take 1,000 mg by mouth 2 (two) times daily with a meal.    . nitroGLYCERIN (NITROSTAT) 0.4 MG SL tablet Place 0.4 mg under the tongue every 5 (five) minutes as needed for chest pain.     Marland Kitchen nystatin-triamcinolone (MYCOLOG II) cream Apply 1 application topically 2 (two) times daily as needed (rash).     Marland Kitchen oxyCODONE-acetaminophen (PERCOCET/ROXICET) 5-325 MG tablet Take 1-2 tablets by mouth every 6 (six) hours as needed for moderate pain. (Patient not taking: Reported on 06/30/2016) 30 tablet 0  . potassium chloride (KLOR-CON) 20 MEQ packet Take 20 mEq by mouth daily.    . pregabalin (LYRICA) 50 MG capsule Take 50 mg by mouth 2 (two) times daily.     No current facility-administered medications on file prior to visit.    Allergies  Allergen Reactions  . No Known Allergies     No results found for this or any previous visit (from the past 2160 hour(s)).  Objective: General: Patient is awake, alert, and oriented x 3 and in no acute distress.  Integument: Skin is warm, dry and supple bilateral. Nails are mildly incurvated at bilateral hallux with evidence that patient has been self trimming  because nail margins are irregular. There is no surrounding nail fold edema, erythema, or active drainage or acute infection. All other nails 2 through 5 bilateral are short, thick, and asymptomatic. No open lesions or preulcerative lesions present bilateral.   Vasculature:  Dorsalis Pedis pulse 0/4 bilateral. Posterior Tibial pulse  0/4 bilateral, difficult to palpate due to trace edema bilateral. Capillary fill time <5 sec 1-5 bilateral. Diminished hair growth to the level of the digits. Temperature gradient within normal limits. Brawny hyperpigmentation and trophic skin changes to bilateral legs.   Neurology: The patient has diminished sensation measured with a 5.07/10g Semmes  Weinstein Monofilament at all pedal sites bilateral. Vibratory sensation diminished bilateral with tuning fork. No Babinski sign present bilateral.   Musculoskeletal: Palpable dorsal bone spurs at the first metatarsophalangeal joints bilateral, supportive of arthritis. Muscular strength 5/5 in all lower extremity muscular groups bilateral. No tenderness with calf compression bilateral.  Assessment and Plan: Problem List Items Addressed This Visit    None    Visit Diagnoses    Ingrown nail    -  Primary   Toe pain, bilateral       Diabetic polyneuropathy associated with type 2 diabetes mellitus (HCC)       Peripheral vascular disease (HCC)       Long term current use of anticoagulant         -Examined patient. -Discussed and educated patient on diabetic foot care, especially with  regards to the vascular, neurological and musculoskeletal systems.  -Stressed the importance of good glycemic control and the detriment of not  controlling glucose levels in relation to the foot. -Discussed case with Dr. [name], Vascular "His digital pressures are both >60 and so I think it is reasonable to proceed without further vascular intervention as long as he is willing to assume the risks" Discussed treatment alternatives and plan of care; Explained permanent/temporary nail avulsion and post procedure course to patient. Patient opt for PNA bilateral hallux at both margins - After a verbal consent, injected 3 ml of a 50:50 mixture of 2% plain  lidocaine and 0.5% plain marcaine in a normal hallux block fashion. Next, a  betadine prep was performed. Anesthesia was tested and found to be appropriate.  The offending right and left hallux medial and lateral nail borders were then incised from the hyponychium to the epinychium. The offending nail borders were removed and cleared from the field. The area was curretted for any remaining nail or spicules. Phenol application performed and the area was then flushed with  alcohol and dressed with antibiotic cream and a dry sterile dressing. -Patient was instructed to leave the dressing intact for today and begin soaking  in a weak solution of betadine or Epsom salt and water tomorrow. Patient was instructed to  soak for 15 minutes each day and apply neosporin and a gauze or bandaid dressing each day. -Patient was instructed to monitor the toe for signs of infection and return to office if toe becomes red, hot or swollen. -Return in 2 weeks for nail check -Patient advised to call the office if any problems or questions arise in the meantime.  Asencion Islamitorya Kanin Lia, DPM

## 2016-09-25 ENCOUNTER — Ambulatory Visit (INDEPENDENT_AMBULATORY_CARE_PROVIDER_SITE_OTHER): Payer: Medicaid Other | Admitting: Sports Medicine

## 2016-09-25 DIAGNOSIS — I739 Peripheral vascular disease, unspecified: Secondary | ICD-10-CM

## 2016-09-25 DIAGNOSIS — Z7901 Long term (current) use of anticoagulants: Secondary | ICD-10-CM

## 2016-09-25 DIAGNOSIS — E1142 Type 2 diabetes mellitus with diabetic polyneuropathy: Secondary | ICD-10-CM

## 2016-09-25 DIAGNOSIS — M79675 Pain in left toe(s): Secondary | ICD-10-CM

## 2016-09-25 DIAGNOSIS — Z9889 Other specified postprocedural states: Secondary | ICD-10-CM

## 2016-09-25 DIAGNOSIS — M79674 Pain in right toe(s): Secondary | ICD-10-CM

## 2016-09-25 DIAGNOSIS — L600 Ingrowing nail: Secondary | ICD-10-CM

## 2016-09-26 NOTE — Progress Notes (Signed)
Subjective: Marcus Glass is a 56 y.o. male patient returns to office today for follow up evaluation after having Right and Left Hallux medial/lateral permanent nail avulsions performed on 09-10-16. Patient has been soaking using betadine and applying topical antibiotic covered with bandaid daily. Patient admits soreness to the toes especially the right, denies fever/chills/nausea/vomitting/any other related constitutional symptoms at this time.  Patient Active Problem List   Diagnosis Date Noted  . Groin pain 02/14/2016  . Non-healing left groin open wound 02/14/2016  . Vascular claudication (HCC) 01/26/2016  . PAD (peripheral artery disease) (HCC) 01/26/2016  . Intractable pain 01/26/2016  . Bed sore on buttock 01/26/2016  . Diabetes mellitus type II, non insulin dependent (HCC) 01/26/2016  . CAD (coronary artery disease) 01/26/2016  . PAF (paroxysmal atrial fibrillation) (HCC) 01/26/2016  . Hyponatremia 01/26/2016  . Chronic diastolic CHF (congestive heart failure) (HCC) 01/26/2016    Current Outpatient Prescriptions on File Prior to Visit  Medication Sig Dispense Refill  . albuterol (PROVENTIL HFA;VENTOLIN HFA) 108 (90 Base) MCG/ACT inhaler Inhale 2 puffs into the lungs every 6 (six) hours as needed for wheezing or shortness of breath.     Marland Kitchen amiodarone (PACERONE) 200 MG tablet Take 200 mg by mouth daily.     Marland Kitchen amitriptyline (ELAVIL) 25 MG tablet Take 25 mg by mouth at bedtime as needed for sleep.     Marland Kitchen apixaban (ELIQUIS) 5 MG TABS tablet Take 5 mg by mouth 2 (two) times daily.    Marland Kitchen aspirin EC 81 MG tablet Take 81 mg by mouth daily.    Marland Kitchen atorvastatin (LIPITOR) 80 MG tablet Take 80 mg by mouth every morning.     . carvedilol (COREG) 6.25 MG tablet Take 6.25 mg by mouth 2 (two) times daily with a meal.    . clonazePAM (KLONOPIN) 1 MG tablet Take 1 mg by mouth 2 (two) times daily.     . furosemide (LASIX) 40 MG tablet Take 120 mg by mouth 2 (two) times daily.     . insulin aspart  (NOVOLOG) 100 UNIT/ML injection Inject 4 Units into the skin 3 (three) times daily with meals. 10 mL 11  . insulin glargine (LANTUS) 100 UNIT/ML injection Inject 0.2 mLs (20 Units total) into the skin daily. (Patient taking differently: Inject 20 Units into the skin at bedtime. ) 10 mL 11  . lisinopril (PRINIVIL,ZESTRIL) 5 MG tablet Take 5 mg by mouth daily.    . metFORMIN (GLUCOPHAGE) 1000 MG tablet Take 1,000 mg by mouth 2 (two) times daily with a meal.    . nitroGLYCERIN (NITROSTAT) 0.4 MG SL tablet Place 0.4 mg under the tongue every 5 (five) minutes as needed for chest pain.     Marland Kitchen nystatin-triamcinolone (MYCOLOG II) cream Apply 1 application topically 2 (two) times daily as needed (rash).     Marland Kitchen oxyCODONE-acetaminophen (PERCOCET/ROXICET) 5-325 MG tablet Take 1-2 tablets by mouth every 6 (six) hours as needed for moderate pain. (Patient not taking: Reported on 06/30/2016) 30 tablet 0  . potassium chloride (KLOR-CON) 20 MEQ packet Take 20 mEq by mouth daily.    . pregabalin (LYRICA) 50 MG capsule Take 50 mg by mouth 2 (two) times daily.     No current facility-administered medications on file prior to visit.     Allergies  Allergen Reactions  . No Known Allergies     Objective:  General: Well developed, nourished, in no acute distress, alert and oriented x3   Dermatology: Skin is warm, dry and  supple bilateral. Right and left hallux medial/lateral nail beds appearsto be clean, dry, with mild granular tissue and surrounding eschar/scab with maceration on R>L. (-) Erythema. (+) Edema. (+) serosanguous drainage present. The remaining nails appear unremarkable at this time. There are no other lesions or other signs of infection  present.  Neurovascular status: Diminished. No lower extremity swelling besides focal to procedure site; No pain with calf compression bilateral.  Musculoskeletal: Decreased tenderness to palpation of the L>R hallux nail fold(s). Muscular strength within normal limits  bilateral.   Assesement and Plan: Problem List Items Addressed This Visit    None    Visit Diagnoses    S/P nail surgery    -  Primary   Ingrown nail       Toe pain, bilateral       Diabetic polyneuropathy associated with type 2 diabetes mellitus (HCC)       Peripheral vascular disease (HCC)       Long term current use of anticoagulant          -Examined patient  -Cleansed right/left hallux medial/lateral nail folds and gently scrubbed with peroxide and q-tip/curetted away eschar at site and applied antibiotic cream covered with bandaid.  -Discussed plan of care with patient. -Patient to continue with betadine and warm water. Patient was instructed to soak for 15-20 minutes each day until the toe appears normal and there is no drainage, redness, tenderness, or swelling at the procedure site, and apply neosporin and a gauze or bandaid dressing each day as needed. May leave open to air at night. -Educated patient on long term care after nail surgery. -Patient was instructed to monitor the toe for reoccurrence and signs of infection; Patient advised to return to office or go to ER if toe becomes red, hot or swollen. -Patient is to return in 3 weeks for another nail check since he is a high risk patient or sooner if problems arise.  Asencion Islam, DPM

## 2016-10-02 ENCOUNTER — Encounter (HOSPITAL_COMMUNITY): Payer: Medicaid Other

## 2016-10-02 ENCOUNTER — Ambulatory Visit: Payer: Medicaid Other | Admitting: Vascular Surgery

## 2016-10-16 ENCOUNTER — Ambulatory Visit: Payer: Medicaid Other | Admitting: Sports Medicine

## 2016-10-21 ENCOUNTER — Ambulatory Visit (INDEPENDENT_AMBULATORY_CARE_PROVIDER_SITE_OTHER): Payer: Medicaid Other | Admitting: Sports Medicine

## 2016-10-21 ENCOUNTER — Encounter: Payer: Self-pay | Admitting: Sports Medicine

## 2016-10-21 DIAGNOSIS — L600 Ingrowing nail: Secondary | ICD-10-CM

## 2016-10-21 DIAGNOSIS — Z9889 Other specified postprocedural states: Secondary | ICD-10-CM

## 2016-10-21 NOTE — Progress Notes (Signed)
Subjective: Marcus Glass is a 56 y.o. male patient returns to office today for follow up evaluation after having Right and Left Hallux medial and lateral permanent nail avulsions performed on 09-10-16. Patient has been soaking using Epsom salt and applying topical antibiotic covered with bandaid daily. Patient admits soreness to the toes especially the right with a burning sensation sometimes 7/10 feels better with compression, denies fever/chills/nausea/vomitting/any other related constitutional symptoms at this time.  Patient Active Problem List   Diagnosis Date Noted  . Groin pain 02/14/2016  . Non-healing left groin open wound 02/14/2016  . Vascular claudication (HCC) 01/26/2016  . PAD (peripheral artery disease) (HCC) 01/26/2016  . Intractable pain 01/26/2016  . Bed sore on buttock 01/26/2016  . Diabetes mellitus type II, non insulin dependent (HCC) 01/26/2016  . CAD (coronary artery disease) 01/26/2016  . PAF (paroxysmal atrial fibrillation) (HCC) 01/26/2016  . Hyponatremia 01/26/2016  . Chronic diastolic CHF (congestive heart failure) (HCC) 01/26/2016    Current Outpatient Prescriptions on File Prior to Visit  Medication Sig Dispense Refill  . albuterol (PROVENTIL HFA;VENTOLIN HFA) 108 (90 Base) MCG/ACT inhaler Inhale 2 puffs into the lungs every 6 (six) hours as needed for wheezing or shortness of breath.     Marland Kitchen amiodarone (PACERONE) 200 MG tablet Take 200 mg by mouth daily.     Marland Kitchen amitriptyline (ELAVIL) 25 MG tablet Take 25 mg by mouth at bedtime as needed for sleep.     Marland Kitchen apixaban (ELIQUIS) 5 MG TABS tablet Take 5 mg by mouth 2 (two) times daily.    Marland Kitchen aspirin EC 81 MG tablet Take 81 mg by mouth daily.    Marland Kitchen atorvastatin (LIPITOR) 80 MG tablet Take 80 mg by mouth every morning.     . carvedilol (COREG) 6.25 MG tablet Take 6.25 mg by mouth 2 (two) times daily with a meal.    . clonazePAM (KLONOPIN) 1 MG tablet Take 1 mg by mouth 2 (two) times daily.     . furosemide (LASIX) 40 MG  tablet Take 120 mg by mouth 2 (two) times daily.     . insulin aspart (NOVOLOG) 100 UNIT/ML injection Inject 4 Units into the skin 3 (three) times daily with meals. 10 mL 11  . insulin glargine (LANTUS) 100 UNIT/ML injection Inject 0.2 mLs (20 Units total) into the skin daily. (Patient taking differently: Inject 20 Units into the skin at bedtime. ) 10 mL 11  . lisinopril (PRINIVIL,ZESTRIL) 5 MG tablet Take 5 mg by mouth daily.    . metFORMIN (GLUCOPHAGE) 1000 MG tablet Take 1,000 mg by mouth 2 (two) times daily with a meal.    . nitroGLYCERIN (NITROSTAT) 0.4 MG SL tablet Place 0.4 mg under the tongue every 5 (five) minutes as needed for chest pain.     Marland Kitchen nystatin-triamcinolone (MYCOLOG II) cream Apply 1 application topically 2 (two) times daily as needed (rash).     Marland Kitchen oxyCODONE-acetaminophen (PERCOCET/ROXICET) 5-325 MG tablet Take 1-2 tablets by mouth every 6 (six) hours as needed for moderate pain. 30 tablet 0  . potassium chloride (KLOR-CON) 20 MEQ packet Take 20 mEq by mouth daily.    . pregabalin (LYRICA) 50 MG capsule Take 50 mg by mouth 2 (two) times daily.     No current facility-administered medications on file prior to visit.     Allergies  Allergen Reactions  . No Known Allergies     Objective:  General: Well developed, nourished, in no acute distress, alert and oriented x3  Dermatology: Skin is warm, dry and supple bilateral. Right and left hallux medial and lateral nail beds appears to be clean, dry, with mild granular tissue and surrounding eschar/scab on R>L. (-) Erythema. (+) Edema. (+) serosanguous drainage present. The remaining nails appear unremarkable at this time. There are no other lesions or other signs of infection  present.  Neurovascular status: Diminished. No lower extremity swelling besides focal to procedure site; No pain with calf compression bilateral.  Musculoskeletal: Decreased tenderness to palpation of the L>R hallux nail fold(s). Muscular strength  within normal limits bilateral.   Assesement and Plan: Problem List Items Addressed This Visit    None    Visit Diagnoses    S/P nail surgery    -  Primary      -Examined patient  -Cleansed right/left hallux medial/lateral nail folds and gently scrubbed with peroxide and q-tip/curetted away eschar at site and applied antibiotic cream covered with bandaid.  -Re-Discussed plan of care with patient. -Patient to continue soak with Epsom salt and warm water. Patient was instructed to soak for 15-20 minutes each day until the toe appears normal and there is no drainage, redness, tenderness, or swelling at the procedure site, and apply neosporin with lidocaine and a gauze or bandaid dressing each day as needed. May leave open to air at night. -Educated patient on long term care after nail surgery. -Patient was instructed to monitor the toe for reoccurrence and signs of infection; Patient advised to return to office or go to ER if toe becomes red, hot or swollen. -Patient is to return in 4 weeks for another nail check since he is a high risk patient or sooner if problems arise.  Asencion Islamitorya Mansel Strother, DPM

## 2016-10-21 NOTE — Progress Notes (Signed)
   Subjective:    Patient ID: Marcus Glass, male    DOB: 06/30/1960, 56 y.o.   MRN: [id number]  HPI    Review of Systems  All other systems reviewed and are negative.      Objective:   Physical Exam        Assessment & Plan:

## 2016-10-21 NOTE — Patient Instructions (Signed)

## 2016-11-13 ENCOUNTER — Ambulatory Visit (INDEPENDENT_AMBULATORY_CARE_PROVIDER_SITE_OTHER): Payer: Medicaid Other | Admitting: Vascular Surgery

## 2016-11-13 ENCOUNTER — Other Ambulatory Visit: Payer: Self-pay | Admitting: *Deleted

## 2016-11-13 ENCOUNTER — Encounter: Payer: Self-pay | Admitting: Vascular Surgery

## 2016-11-13 ENCOUNTER — Ambulatory Visit (HOSPITAL_COMMUNITY)
Admission: RE | Admit: 2016-11-13 | Discharge: 2016-11-13 | Disposition: A | Discharge status: Home / Self Care | Payer: Medicaid Other | Source: Ambulatory Visit | Attending: Family | Admitting: Family

## 2016-11-13 ENCOUNTER — Encounter: Payer: Self-pay | Admitting: *Deleted

## 2016-11-13 VITALS — BP 110/62 | HR 72 | Temp 98.2°F | Resp 16 | Ht 68.0 in | Wt 245.0 lb

## 2016-11-13 DIAGNOSIS — E1151 Type 2 diabetes mellitus with diabetic peripheral angiopathy without gangrene: Secondary | ICD-10-CM | POA: Diagnosis not present

## 2016-11-13 DIAGNOSIS — I779 Disorder of arteries and arterioles, unspecified: Secondary | ICD-10-CM

## 2016-11-13 DIAGNOSIS — Z87891 Personal history of nicotine dependence: Secondary | ICD-10-CM | POA: Diagnosis not present

## 2016-11-13 DIAGNOSIS — I739 Peripheral vascular disease, unspecified: Secondary | ICD-10-CM | POA: Insufficient documentation

## 2016-11-13 DIAGNOSIS — E1165 Type 2 diabetes mellitus with hyperglycemia: Secondary | ICD-10-CM

## 2016-11-13 DIAGNOSIS — IMO0002 Reserved for concepts with insufficient information to code with codable children: Secondary | ICD-10-CM

## 2016-11-13 NOTE — Progress Notes (Signed)
Patient ID: Marcus LiterWilliam I Luzier, male   DOB: 02/10/1960, 56 y.o.   MRN: [id number]  Reason for Consult: PAD   Referred by Paulina FusiSchultz, Douglas E, MD  Subjective:     HPI:  Marcus Glass is a 56 y.o. male here for six-month follow-up after left common femoral endarterectomy with angioplasty. This. The above groin wound which required wound VAC for healing. He remains on L) for his heart as well as aspirin. He gives a preclassic history of claudication today with cramping that resolves with rest located mostly in his calves be does have some in his thighs as well. He can walk less than 100 yards and says that his quite a bit life limiting. He had ABIs performed prior to evaluate visit. He does not have tissue loss or ulceration at this time.  Past Medical History:  Diagnosis Date  . Acute ST-segment elevation myocardial infarction (HCC) 07/2014  . Anxiety   . Bilateral leg edema   . CAD (coronary artery disease)   . CHF (congestive heart failure) (HCC)   . Chronic venous insufficiency   . Falling episodes   . High cholesterol   . History of kidney stones    "in my 20's and 30's"  . Hyperlipidemia   . Hypertension    hx (02/14/2016)  . Hypokalemia   . Left ventricular systolic dysfunction   . Paroxysmal atrial fibrillation (HCC)   . Pneumonia ~ 2013  . Sleep apnea    "tried it; didn't like it; need to get one/PCP" (02/14/2016)  . Sternum pain   . Type II diabetes mellitus (HCC)    Family History  Problem Relation Age of Onset  . Heart disease Mother   . Hyperlipidemia Mother   . Hypertension Mother   . Cancer Father        lung and prostate   . Diabetes Father    Past Surgical History:  Procedure Laterality Date  . APPENDECTOMY    . CORONARY ANGIOPLASTY  04/2014   "reopened clotted stents"  . CORONARY ANGIOPLASTY WITH STENT PLACEMENT  01/2012  . CORONARY ARTERY BYPASS GRAFT  07/2014   "CABG X?4"  . HERNIA REPAIR    . LAPAROSCOPIC INCISIONAL / UMBILICAL / VENTRAL HERNIA  REPAIR  1990s   UHR    Short Social History:  Social History   Tobacco Use  . Smoking status: Former Smoker    Packs/day: 0.50    Years: 32.00    Pack years: 16.00    Types: Cigarettes    Last attempt to quit: 09/05/2013    Years since quitting: 3.1  . Smokeless tobacco: Never Used  Substance Use Topics  . Alcohol use: Yes    Comment: 02/14/2016 "glass of wine maybe once/year"    Allergies  Allergen Reactions  . No Known Allergies     Current Outpatient Medications  Medication Sig Dispense Refill  . albuterol (PROVENTIL HFA;VENTOLIN HFA) 108 (90 Base) MCG/ACT inhaler Inhale 2 puffs into the lungs every 6 (six) hours as needed for wheezing or shortness of breath.     Marland Kitchen. amiodarone (PACERONE) 200 MG tablet Take 200 mg by mouth daily.     Marland Kitchen. amitriptyline (ELAVIL) 25 MG tablet Take 25 mg by mouth at bedtime as needed for sleep.     Marland Kitchen. apixaban (ELIQUIS) 5 MG TABS tablet Take 5 mg by mouth 2 (two) times daily.    Marland Kitchen. aspirin EC 81 MG tablet Take 81 mg by mouth daily.    .Marland Kitchen  atorvastatin (LIPITOR) 80 MG tablet Take 80 mg by mouth every morning.     . carvedilol (COREG) 6.25 MG tablet Take 6.25 mg by mouth 2 (two) times daily with a meal.    . clonazePAM (KLONOPIN) 1 MG tablet Take 1 mg by mouth 2 (two) times daily.     . furosemide (LASIX) 40 MG tablet Take 120 mg by mouth 2 (two) times daily.     . insulin aspart (NOVOLOG) 100 UNIT/ML injection Inject 4 Units into the skin 3 (three) times daily with meals. 10 mL 11  . insulin glargine (LANTUS) 100 UNIT/ML injection Inject 0.2 mLs (20 Units total) into the skin daily. (Patient taking differently: Inject 20 Units into the skin at bedtime. ) 10 mL 11  . lisinopril (PRINIVIL,ZESTRIL) 5 MG tablet Take 5 mg by mouth daily.    . metFORMIN (GLUCOPHAGE) 1000 MG tablet Take 1,000 mg by mouth 2 (two) times daily with a meal.    . nitroGLYCERIN (NITROSTAT) 0.4 MG SL tablet Place 0.4 mg under the tongue every 5 (five) minutes as needed for chest pain.      Marland Kitchen nystatin-triamcinolone (MYCOLOG II) cream Apply 1 application topically 2 (two) times daily as needed (rash).     . potassium chloride (KLOR-CON) 20 MEQ packet Take 20 mEq by mouth daily.    . pregabalin (LYRICA) 50 MG capsule Take 50 mg by mouth 2 (two) times daily.    Marland Kitchen oxyCODONE-acetaminophen (PERCOCET/ROXICET) 5-325 MG tablet Take 1-2 tablets by mouth every 6 (six) hours as needed for moderate pain. (Patient not taking: Reported on 11/13/2016) 30 tablet 0   No current facility-administered medications for this visit.     Review of Systems  Constitutional:  Constitutional negative. HENT: HENT negative.  Eyes: Eyes negative.  Cardiovascular: Positive for claudication.  GI: Gastrointestinal negative.  Skin: Skin negative.  Hematologic: Hematologic/lymphatic negative.  Psychiatric: Psychiatric negative.        Objective:  Objective   Vitals:   11/13/16 1439  BP: 110/62  Pulse: 72  Resp: 16  Temp: 98.2 F (36.8 C)  SpO2: 95%  Weight: 245 lb (111.1 kg)  Height: 5\' 8"  (1.727 m)   Body mass index is 37.25 kg/m.  Physical Exam  Constitutional: He is oriented to person, place, and time. He appears well-developed.  HENT:  Head: Normocephalic.  Eyes: Pupils are equal, round, and reactive to light.  Neck: Normal range of motion.  Cardiovascular: Normal rate.  Monophasic signals bilateral dp/pt  Pulmonary/Chest: Effort normal.  Abdominal: Soft. He exhibits no mass.  Musculoskeletal: Normal range of motion. He exhibits no edema.  Neurological: He is alert and oriented to person, place, and time.  Skin: Skin is warm and dry.  Scarred in left groin incision  Psychiatric: He has a normal mood and affect. His behavior is normal. Judgment and thought content normal.    Data: ABIs performed today demonstrate 0.87 bilaterally based on the PT. These are monophasic bilaterally suggesting mild lower extremity arterial occlusive disease.     Assessment/Plan:     56yo male  status post left common femoral endarterectomy on eliquis and aspirin. He does have ABIs of 0.87 but gives of very classic history of claudication suggesting that possibly his ABIs are falsely elevated. I discussed with him doing nothing versus angiography and he was to proceed with that. His right lower extremity is more symptomatic and so we'll look at that first of possible given the previous scarring in his left groin. It  did appear that possibly a common femoral disease on the right or possibly SFA takeoff disease and also possibly right common iliac artery disease 1 we were evaluating his left lower extremity. We discussed the risk and benefits and he demonstrates good understanding and we will get him signed up for angiogram possible intervention of the right and the very near future. He will need whole milk was 48 hours prior.     Maeola HarmanBrandon Christopher Calla Wedekind MD [facility]

## 2016-11-18 ENCOUNTER — Ambulatory Visit: Payer: Medicaid Other | Admitting: Sports Medicine

## 2016-11-18 ENCOUNTER — Ambulatory Visit (HOSPITAL_COMMUNITY)
Admission: RE | Admit: 2016-11-18 | Discharge: 2016-11-18 | Disposition: A | Discharge status: Home / Self Care | Payer: Medicaid Other | Source: Ambulatory Visit | Attending: Vascular Surgery | Admitting: Vascular Surgery

## 2016-11-18 ENCOUNTER — Encounter (HOSPITAL_COMMUNITY)
Admission: RE | Disposition: A | Discharge status: Home / Self Care | Payer: Self-pay | Source: Ambulatory Visit | Attending: Vascular Surgery

## 2016-11-18 DIAGNOSIS — M79605 Pain in left leg: Secondary | ICD-10-CM | POA: Insufficient documentation

## 2016-11-18 DIAGNOSIS — M79604 Pain in right leg: Secondary | ICD-10-CM | POA: Diagnosis not present

## 2016-11-18 HISTORY — PX: LOWER EXTREMITY ANGIOGRAPHY: CATH118251

## 2016-11-18 LAB — POCT I-STAT, CHEM 8
BUN: 14 mg/dL (ref 6–20)
CHLORIDE: 99 mmol/L — AB (ref 101–111)
CREATININE: 0.8 mg/dL (ref 0.61–1.24)
Calcium, Ion: 1.11 mmol/L — ABNORMAL LOW (ref 1.15–1.40)
GLUCOSE: 359 mg/dL — AB (ref 65–99)
HCT: 46 % (ref 39.0–52.0)
Hemoglobin: 15.6 g/dL (ref 13.0–17.0)
POTASSIUM: 3.5 mmol/L (ref 3.5–5.1)
Sodium: 135 mmol/L (ref 135–145)
TCO2: 25 mmol/L (ref 22–32)

## 2016-11-18 LAB — GLUCOSE, CAPILLARY
GLUCOSE-CAPILLARY: 261 mg/dL — AB (ref 65–99)
GLUCOSE-CAPILLARY: 238 mg/dL — AB (ref 65–99)
GLUCOSE-CAPILLARY: 209 mg/dL — AB (ref 65–99)
Glucose-Capillary: 317 mg/dL — ABNORMAL HIGH (ref 65–99)

## 2016-11-18 SURGERY — LOWER EXTREMITY ANGIOGRAPHY
Anesthesia: LOCAL | Laterality: Right

## 2016-11-18 MED ORDER — MIDAZOLAM HCL 2 MG/2ML IJ SOLN
INTRAMUSCULAR | Status: DC | PRN
  Administered 2016-11-18: 1 mg via INTRAVENOUS

## 2016-11-18 MED ORDER — HEPARIN (PORCINE) IN NACL 2-0.9 UNIT/ML-% IJ SOLN
INTRAMUSCULAR | Status: AC | PRN
  Administered 2016-11-18: 1000 mL

## 2016-11-18 MED ORDER — INSULIN ASPART 100 UNIT/ML ~~LOC~~ SOLN
10.0000 [IU] | Freq: Once | SUBCUTANEOUS | Status: AC
  Administered 2016-11-18: 10 [IU] via SUBCUTANEOUS
  Filled 2016-11-18: qty 0.1

## 2016-11-18 MED ORDER — ASPIRIN EC 81 MG PO TBEC
81.0000 mg | DELAYED_RELEASE_TABLET | Freq: Every day | ORAL | Status: DC
  Filled 2016-11-18: qty 1

## 2016-11-18 MED ORDER — SODIUM CHLORIDE 0.9 % WEIGHT BASED INFUSION: 1.0000 mL/kg/h | INTRAVENOUS | Status: DC

## 2016-11-18 MED ORDER — HEPARIN (PORCINE) IN NACL 2-0.9 UNIT/ML-% IJ SOLN
INTRAMUSCULAR | Status: AC
  Filled 2016-11-18: qty 500

## 2016-11-18 MED ORDER — INSULIN ASPART 100 UNIT/ML ~~LOC~~ SOLN
SUBCUTANEOUS | Status: AC
  Administered 2016-11-18: 10 [IU] via SUBCUTANEOUS
  Filled 2016-11-18: qty 1

## 2016-11-18 MED ORDER — HYDRALAZINE HCL 20 MG/ML IJ SOLN: 5.0000 mg | INTRAMUSCULAR | Status: DC | PRN

## 2016-11-18 MED ORDER — LABETALOL HCL 5 MG/ML IV SOLN: 10.0000 mg | INTRAVENOUS | Status: DC | PRN

## 2016-11-18 MED ORDER — LIDOCAINE HCL (PF) 1 % IJ SOLN
INTRAMUSCULAR | Status: DC | PRN
Start: 2016-11-18 — End: 2016-11-18
  Administered 2016-11-18: 20 mL via INTRADERMAL

## 2016-11-18 MED ORDER — SODIUM CHLORIDE 0.9 % IV SOLN
INTRAVENOUS | Status: DC
  Administered 2016-11-18: 09:00:00 via INTRAVENOUS

## 2016-11-18 MED ORDER — SODIUM CHLORIDE 0.9 % IV SOLN: 250.0000 mL | INTRAVENOUS | Status: DC | PRN

## 2016-11-18 MED ORDER — LIDOCAINE HCL (PF) 1 % IJ SOLN
INTRAMUSCULAR | Status: AC
  Filled 2016-11-18: qty 30

## 2016-11-18 MED ORDER — FENTANYL CITRATE (PF) 100 MCG/2ML IJ SOLN
INTRAMUSCULAR | Status: DC | PRN
  Administered 2016-11-18: 50 ug via INTRAVENOUS

## 2016-11-18 MED ORDER — SODIUM CHLORIDE 0.9% FLUSH: 3.0000 mL | INTRAVENOUS | Status: DC | PRN

## 2016-11-18 MED ORDER — INSULIN ASPART 100 UNIT/ML ~~LOC~~ SOLN
4.0000 [IU] | Freq: Once | SUBCUTANEOUS | Status: AC
  Administered 2016-11-18: 4 [IU] via SUBCUTANEOUS
  Filled 2016-11-18: qty 0.04

## 2016-11-18 MED ORDER — IODIXANOL 320 MG/ML IV SOLN
INTRAVENOUS | Status: DC | PRN
  Administered 2016-11-18: 125 mL via INTRA_ARTERIAL

## 2016-11-18 MED ORDER — MIDAZOLAM HCL 2 MG/2ML IJ SOLN
INTRAMUSCULAR | Status: AC
  Filled 2016-11-18: qty 2

## 2016-11-18 MED ORDER — SODIUM CHLORIDE 0.9% FLUSH: 3.0000 mL | Freq: Two times a day (BID) | INTRAVENOUS | Status: DC

## 2016-11-18 MED ORDER — INSULIN ASPART 100 UNIT/ML ~~LOC~~ SOLN
SUBCUTANEOUS | Status: AC
  Administered 2016-11-18: 4 [IU] via SUBCUTANEOUS
  Filled 2016-11-18: qty 1

## 2016-11-18 MED ORDER — FENTANYL CITRATE (PF) 100 MCG/2ML IJ SOLN
INTRAMUSCULAR | Status: AC
  Filled 2016-11-18: qty 2

## 2016-11-18 SURGICAL SUPPLY — 12 items
CATH ANGIO 5F PIGTAIL 65CM (CATHETERS) IMPLANT
CATH OMNI FLUSH 5F 65CM (CATHETERS) ×3 IMPLANT
COVER PRB 48X5XTLSCP FOLD TPE (BAG) ×2 IMPLANT
COVER PROBE 5X48 (BAG) ×1
KIT MICROINTRODUCER STIFF 5F (SHEATH) ×3 IMPLANT
KIT PV (KITS) ×3 IMPLANT
SHEATH PINNACLE 5F 10CM (SHEATH) ×3 IMPLANT
SYR MEDRAD MARK V 150ML (SYRINGE) ×3 IMPLANT
TRANSDUCER W/STOPCOCK (MISCELLANEOUS) ×3 IMPLANT
TRAY PV CATH (CUSTOM PROCEDURE TRAY) ×3 IMPLANT
WIRE AMPLATZ SS-J .035X180CM (WIRE) ×3 IMPLANT
WIRE BENTSON .035X145CM (WIRE) ×3 IMPLANT

## 2016-11-18 NOTE — Progress Notes (Signed)
5 Fr sheath aspirated and pulled from left groin. Manual pressure held for 15 minutes. No bleeding, bruising, or hematoma noted. Patient tolerated well. Tegaderm and gauze dressing applied and directions were given for care. Patient verbalizes understanding.   Bedrest begins at 1300.

## 2016-11-18 NOTE — Op Note (Signed)
    Patient name: Marcus Glass MRN: [id number] DOB: 09/26/1960 Sex: male  11/18/2016 Pre-operative Diagnosis: bilateral lower extremity pain Post-operative diagnosis:  Same Surgeon:  Apolinar JunesBrandon C. [name], MD Procedure Performed: 1.  US guided cannulation of left common femoral artery 2.  Aortogram with bilateral lower extremity runoff 3.  Moderate sedation with fentanyl and versed for 22 minutes  Indications: 56 year old male with history left common femoral endarterectomy had delayed healing.  He now has bilateral lower extremity pain right greater than left and is indicated for angiogram possible intervention.  Findings: The aortoiliac segments are diseased however there is no flow-limiting stenoses.  Common femoral arteries bilaterally are patent including the left side where there is an endarterectomy.  Possible 50% stenosis of the takeoff of the SFA on the right.  There are multiple non-flow-limiting stenosis throughout the SFA on the right and there is three-vessel runoff on bilateral feet.   Procedure:  The patient was identified in the holding area and taken to room 8.  The patient was then placed supine on the table and prepped and draped in the usual sterile fashion.  A time out was called.  Ultrasound was used to evaluate the left common femoral artery.  It was patent .  A digital ultrasound image was acquired.  A micropuncture needle was used to access the left common femoral artery under ultrasound guidance.  An 018 wire was advanced without resistance and a micropuncture sheath was placed.  The 018 wire was removed and a benson wire was placed.  The micropuncture sheath was exchanged for a 5 french sheath.  An omniflush catheter was advanced over the wire to the level of L-1.  An abdominal angiogram was obtained followed by pelvic shots and bilateral lower extremity runoff.  We also performed a dedicated right common femoral angiogram.  With the above findings there was no indication for  intervention.  The catheter was removed over a wire sheath will be pulled in postop holding.  Patient tolerated procedure well without immediate complication.  Contrast: 125cc  Shayna Eblen C. [name], MD [facility] Office: [phone] Pager: [phone]

## 2016-11-18 NOTE — Progress Notes (Signed)
[facility], will wait for orders.

## 2016-11-18 NOTE — H&P (Signed)
   History and Physical Update  The patient was interviewed and re-examined.  The patient's previous History and Physical has been reviewed and is unchanged from recent office visit. Plan for aortogram with possible intervention on the right.   Ekam Besson C. [name], MD [facility] Office: [phone] Pager: [phone]   11/18/2016, 8:45 AM

## 2016-11-18 NOTE — Discharge Instructions (Signed)

## 2016-11-19 ENCOUNTER — Encounter (HOSPITAL_COMMUNITY): Payer: Self-pay | Admitting: Vascular Surgery

## 2016-11-19 ENCOUNTER — Telehealth: Payer: Self-pay | Admitting: Vascular Surgery

## 2016-11-19 NOTE — Telephone Encounter (Signed)
Sched appt 05/21/17; lab at 1:00 and MD at 1:30. Mailed appt letter.

## 2016-11-19 NOTE — Telephone Encounter (Signed)
-----   Message from Sharee PimpleMarilyn K McChesney, RN sent at 11/18/2016 12:18 PM EST ----- Regarding: 6 months   ----- Message ----- From: Maeola Harmanain, Brandon Christopher, MD Sent: 11/18/2016  12:04 PM To: [facility] [address]  [name] [id number] 01/23/1960  11/18/2016 Pre-operative Diagnosis: bilateral lower extremity pain  Surgeon:  Luanna SalkBrandon C. [name], MD  Procedure Performed: 1.  US guided cannulation of left common femoral artery 2.  Aortogram with bilateral lower extremity runoff 3.  Moderate sedation with fentanyl and versed for 22 minutes  F/u in 6 months with ABI

## 2016-11-23 ENCOUNTER — Other Ambulatory Visit: Payer: Self-pay

## 2016-11-23 DIAGNOSIS — M79604 Pain in right leg: Secondary | ICD-10-CM

## 2016-11-23 DIAGNOSIS — M79605 Pain in left leg: Principal | ICD-10-CM

## 2017-05-21 ENCOUNTER — Ambulatory Visit: Payer: Medicaid Other

## 2017-05-21 ENCOUNTER — Encounter (HOSPITAL_COMMUNITY): Payer: Medicaid Other

## 2017-08-03 ENCOUNTER — Telehealth: Payer: Self-pay | Admitting: Cardiology

## 2017-08-03 NOTE — Telephone Encounter (Signed)
Spoke with patient regarding palpitations. Patient reports no chest pain at this time. Advised patient that if any chest pain, or left arm pain arises to visit emergency department. Patient denies needing sooner appointment, encouraged pt to contact [place] if closer appointment needed. Patient states he will if he needs to and verbally understands to visit emergency department if chest pain arises before appointment on August 15th.

## 2017-08-03 NOTE — Telephone Encounter (Signed)
Patient last saw Dr. Kirtland BouchardK when he left last year. Patient lost daughter at first of month and has been having palpitations, some chest heaviness/no arm pain or severe chest pain/patient believes this is due to stress of loss. Appt not until 8/15 in . Needs call to see if he needs to be moved up please.

## 2017-08-19 ENCOUNTER — Institutional Professional Consult (permissible substitution): Payer: Medicaid Other | Admitting: Cardiology

## 2017-11-05 ENCOUNTER — Other Ambulatory Visit: Payer: Self-pay | Admitting: Orthopaedic Surgery

## 2019-09-01 DIAGNOSIS — I10 Essential (primary) hypertension: Secondary | ICD-10-CM

## 2019-09-01 DIAGNOSIS — I200 Unstable angina: Secondary | ICD-10-CM

## 2019-09-01 DIAGNOSIS — E785 Hyperlipidemia, unspecified: Secondary | ICD-10-CM

## 2019-09-01 DIAGNOSIS — E669 Obesity, unspecified: Secondary | ICD-10-CM

## 2019-09-01 DIAGNOSIS — I2510 Atherosclerotic heart disease of native coronary artery without angina pectoris: Secondary | ICD-10-CM | POA: Diagnosis not present

## 2019-09-01 DIAGNOSIS — I480 Paroxysmal atrial fibrillation: Secondary | ICD-10-CM

## 2019-09-01 DIAGNOSIS — E1142 Type 2 diabetes mellitus with diabetic polyneuropathy: Secondary | ICD-10-CM

## 2020-08-05 DEATH — deceased
